# Patient Record
Sex: Female | Born: 1985 | Hispanic: Yes | Marital: Single
Health system: Southern US, Community
[De-identification: ages and names within clinical notes are randomized; demographics above are authoritative.]

## PROBLEM LIST (undated history)

## (undated) DIAGNOSIS — T8859XA Other complications of anesthesia, initial encounter: Secondary | ICD-10-CM

## (undated) DIAGNOSIS — R112 Nausea with vomiting, unspecified: Secondary | ICD-10-CM

## (undated) DIAGNOSIS — F419 Anxiety disorder, unspecified: Secondary | ICD-10-CM

## (undated) DIAGNOSIS — F32A Depression, unspecified: Secondary | ICD-10-CM

## (undated) DIAGNOSIS — Z87442 Personal history of urinary calculi: Secondary | ICD-10-CM

## (undated) DIAGNOSIS — I209 Angina pectoris, unspecified: Secondary | ICD-10-CM

## (undated) DIAGNOSIS — R000 Tachycardia, unspecified: Secondary | ICD-10-CM

## (undated) DIAGNOSIS — Z9889 Other specified postprocedural states: Secondary | ICD-10-CM

## (undated) HISTORY — PX: TUBAL LIGATION: SHX77

---

## 2020-10-01 ENCOUNTER — Encounter (HOSPITAL_COMMUNITY): Payer: Self-pay

## 2020-10-01 ENCOUNTER — Emergency Department (HOSPITAL_COMMUNITY)
Admission: EM | Admit: 2020-10-01 | Discharge: 2020-10-01 | Disposition: A | Discharge status: Home / Self Care | Payer: Self-pay | Attending: Emergency Medicine | Admitting: Emergency Medicine

## 2020-10-01 ENCOUNTER — Emergency Department (HOSPITAL_COMMUNITY): Payer: Self-pay

## 2020-10-01 DIAGNOSIS — G44329 Chronic post-traumatic headache, not intractable: Secondary | ICD-10-CM | POA: Insufficient documentation

## 2020-10-01 DIAGNOSIS — R000 Tachycardia, unspecified: Secondary | ICD-10-CM | POA: Insufficient documentation

## 2020-10-01 DIAGNOSIS — R42 Dizziness and giddiness: Secondary | ICD-10-CM | POA: Insufficient documentation

## 2020-10-01 LAB — COMPREHENSIVE METABOLIC PANEL
ALT: 31 U/L (ref 0–44)
AST: 27 U/L (ref 15–41)
Albumin: 4.2 g/dL (ref 3.5–5.0)
Alkaline Phosphatase: 74 U/L (ref 38–126)
Anion gap: 13 (ref 5–15)
BUN: 14 mg/dL (ref 6–20)
CO2: 25 mmol/L (ref 22–32)
Calcium: 9.5 mg/dL (ref 8.9–10.3)
Chloride: 104 mmol/L (ref 98–111)
Creatinine, Ser: 0.73 mg/dL (ref 0.44–1.00)
GFR calc non Af Amer: >60 mL/min (ref >60)
Glucose, Bld: 192 mg/dL — ABNORMAL HIGH (ref 70–99)
Potassium: 3.4 mmol/L — ABNORMAL LOW (ref 3.5–5.1)
Sodium: 142 mmol/L (ref 135–145)
Total Bilirubin: 0.6 mg/dL (ref 0.3–1.2)
Total Protein: 8.2 g/dL — ABNORMAL HIGH (ref 6.5–8.1)

## 2020-10-01 LAB — CBG MONITORING, ED: Glucose-Capillary: 152 mg/dL — ABNORMAL HIGH (ref 70–99)

## 2020-10-01 LAB — CBC
HCT: 42.1 % (ref 36.0–46.0)
Hemoglobin: 13.5 g/dL (ref 12.0–15.0)
MCH: 25 pg — ABNORMAL LOW (ref 26.0–34.0)
MCHC: 32.1 g/dL (ref 30.0–36.0)
MCV: 78.1 fL — ABNORMAL LOW (ref 80.0–100.0)
Platelets: 333 10*3/uL (ref 150–400)
RBC: 5.39 MIL/uL — ABNORMAL HIGH (ref 3.87–5.11)
RDW: 14.2 % (ref 11.5–15.5)
WBC: 9.9 10*3/uL (ref 4.0–10.5)
nRBC: 0 % (ref 0.0–0.2)

## 2020-10-01 LAB — ETHANOL: Alcohol, Ethyl (B): <10 mg/dL (ref <10)

## 2020-10-01 LAB — RAPID URINE DRUG SCREEN, HOSP PERFORMED
Amphetamines: NOT DETECTED
Barbiturates: NOT DETECTED
Benzodiazepines: NOT DETECTED
Cocaine: NOT DETECTED
Opiates: NOT DETECTED
Tetrahydrocannabinol: POSITIVE — AB

## 2020-10-01 LAB — I-STAT BETA HCG BLOOD, ED (MC, WL, AP ONLY): I-stat hCG, quantitative: <5 m[IU]/mL (ref <5)

## 2020-10-01 MED ORDER — SODIUM CHLORIDE 0.9 % IV BOLUS
1000.0000 mL | Freq: Once | INTRAVENOUS | Status: AC
  Administered 2020-10-01: 1000 mL via INTRAVENOUS

## 2020-10-01 MED ORDER — LORAZEPAM 2 MG/ML IJ SOLN
1.0000 mg | Freq: Once | INTRAMUSCULAR | Status: AC
  Administered 2020-10-01: 1 mg via INTRAVENOUS
  Filled 2020-10-01: qty 1
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
## 2020-10-01 NOTE — ED Triage Notes (Addendum)
Pt arrived via walk in with friend, pt unresponsive in wheelchair, able to follow some commands, but non verbal. Triage done using interpreter, pt unable to verbalize information, information gathered via phone from pt sister.   Per pt friend, pt called her saying she had just passed out and needed help, pt found in current state.

## 2020-10-01 NOTE — Discharge Instructions (Addendum)
You were seen in the ER for light headedness, palpitations and difficulty responding   CT head, labs all were normal. You had marijuana in your urine drug screen.   Call [facility] and schedule an appointment with a primary care doctor.  This clinic provides medical care and appointment for patients without medical insurance  I have given you contact information for neurology (brain doctors) that you can call or be referred to for more discussion of your headaches  the cause of your symptoms today is uncertain.  It may have been light headedness from being dehydrated or related to stress or anxiety  Vino a la sala de emergencias por [employer], palpitaciones y dificultad para responder  Tomografa computarizada de la cabeza, todos los laboratorios fueron normales. Tena marihuana en su anlisis de drogas de [place].  Llame a la clnica comunitaria de [facility] y programe una cita con un mdico de atencin primaria. Esta clnica brinda atencin mdica y citas para pacientes sin seguro mdico.  Conley Rolls he dado informacin de contacto de Software engineer (mdicos del cerebro) a la que Scientist, clinical (histocompatibility and immunogenetics) o ser referido para ms informacin sobre sus dolores de [place].  la causa de sus sntomas hoy es incierta. Puede haber sido un mareo por estar deshidratado o relacionado con el estrs o la ansiedad.
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
## 2020-12-10 ENCOUNTER — Ambulatory Visit: Payer: Self-pay | Admitting: Neurology

## 2020-12-10 ENCOUNTER — Encounter: Payer: Self-pay | Admitting: Neurology

## 2020-12-10 ENCOUNTER — Telehealth: Payer: Self-pay | Admitting: Neurology

## 2020-12-10 NOTE — Telephone Encounter (Signed)
No Show for neurology appointment. If patient calls back she can reschedule with any physician at [facility]. However, please explain to patient that she has no-showed once and if she no-shows again or cancels within 24 hours for another appointment, she may be dismissed from the practice.

## 2020-12-17 ENCOUNTER — Telehealth: Payer: Self-pay | Admitting: Neurology

## 2020-12-17 NOTE — Telephone Encounter (Signed)
Patient No Show letter was returned Address unknown - [facility] 12.15.2021

## 2021-03-11 ENCOUNTER — Other Ambulatory Visit: Payer: Self-pay

## 2021-03-11 ENCOUNTER — Ambulatory Visit (INDEPENDENT_AMBULATORY_CARE_PROVIDER_SITE_OTHER): Payer: Self-pay | Admitting: Primary Care

## 2021-03-11 ENCOUNTER — Encounter (INDEPENDENT_AMBULATORY_CARE_PROVIDER_SITE_OTHER): Payer: Self-pay | Admitting: Primary Care

## 2021-03-11 VITALS — BP 104/69 | HR 75 | Temp 97.3°F | Ht <= 58 in | Wt 138.6 lb

## 2021-03-11 DIAGNOSIS — Z7689 Persons encountering health services in other specified circumstances: Secondary | ICD-10-CM

## 2021-03-11 DIAGNOSIS — K219 Gastro-esophageal reflux disease without esophagitis: Secondary | ICD-10-CM

## 2021-03-11 DIAGNOSIS — G43909 Migraine, unspecified, not intractable, without status migrainosus: Secondary | ICD-10-CM | POA: Insufficient documentation

## 2021-03-11 DIAGNOSIS — G43C0 Periodic headache syndromes in child or adult, not intractable: Secondary | ICD-10-CM

## 2021-03-11 DIAGNOSIS — Z23 Encounter for immunization: Secondary | ICD-10-CM

## 2021-03-11 DIAGNOSIS — N926 Irregular menstruation, unspecified: Secondary | ICD-10-CM

## 2021-03-11 MED ORDER — OMEPRAZOLE 20 MG PO CPDR: 20.0000 mg | DELAYED_RELEASE_CAPSULE | Freq: Every day | ORAL | 3 refills | Status: DC

## 2021-03-11 NOTE — Progress Notes (Signed)
New Patient Office Visit  Subjective:  Patient ID: Lori Young, female    DOB: January 26, 1986  Age: 35 y.o. MRN: [id number]  CC:  Chief Complaint  Patient presents with  . New Patient (Initial Visit)    Stomach pain     HPI [name] is a 35 year old Hispanic female Lori Young [phone]) presents for establish care and she has complains of stomach pain. (upper gastric area) Pain for 6 months but recently has become unbearable and causing vomiting.  Aggravating factors foods that are spicy and fried foods. Burning sensation when it is empty . Pain is early in the morning denies coughing. Sleep with a pillow over her chest and sleep on her stomach.  No past medical history on file.  No past surgical history on file.  No family history on file.  Social History   Socioeconomic History  . Marital status: Single    Spouse name: Not on file  . Number of children: Not on file  . Years of education: Not on file  . Highest education level: Not on file  Occupational History  . Not on file  Tobacco Use  . Smoking status: Never Smoker  . Smokeless tobacco: Never Used  Substance and Sexual Activity  . Alcohol use: Not Currently  . Drug use: Never  . Sexual activity: Yes    Partners: Male  Other Topics Concern  . Not on file  Social History Narrative  . Not on file   Social Determinants of Health   Financial Resource Strain: Not on file  Food Insecurity: Not on file  Transportation Needs: Not on file  Physical Activity: Not on file  Stress: Not on file  Social Connections: Not on file  Intimate Partner Violence: Not on file    ROS Review of Systems  Gastrointestinal: Positive for abdominal distention, abdominal pain, nausea and vomiting.  Genitourinary:       Irregular cycles 3-4 days  Neurological: Positive for headaches.       Migraines   All other systems reviewed and are negative.   Objective:   Today's Vitals: BP 104/69 (BP Location: Right Arm, Patient  Position: Sitting, Cuff Size: Large)   Pulse 75   Temp (!) 97.3 F (36.3 C) (Temporal)   Ht 4\' 9"  (1.448 m)   Wt 138 lb 9.6 oz (62.9 kg)   SpO2 93%   BMI 29.99 kg/m   Physical Exam Vitals reviewed.  Constitutional:      Appearance: Normal appearance.  HENT:     Head: Normocephalic.     Right Ear: Tympanic membrane and external ear normal.     Left Ear: Tympanic membrane and external ear normal.     Nose: Nose normal.  Eyes:     Extraocular Movements: Extraocular movements intact.     Pupils: Pupils are equal, round, and reactive to light.  Cardiovascular:     Rate and Rhythm: Normal rate and regular rhythm.  Pulmonary:     Effort: Pulmonary effort is normal.     Breath sounds: Normal breath sounds.  Abdominal:     General: Bowel sounds are normal. There is distension.     Palpations: Abdomen is soft.  Musculoskeletal:        General: Normal range of motion.     Cervical back: Normal range of motion and neck supple.  Skin:    General: Skin is warm and dry.  Neurological:     Mental Status: She is alert and oriented  to person, place, and time.  Psychiatric:        Mood and Affect: Mood normal.        Behavior: Behavior normal.        Thought Content: Thought content normal.        Judgment: Judgment normal.      Assessment & Plan:  Lori Young was seen today for new patient (initial visit).  Diagnoses and all orders for this visit: Lori Young was seen today for new patient (initial visit).  Diagnoses and all orders for this visit:  Encounter to establish care Establish care  Need for Tdap vaccination -     Tdap vaccine greater than or equal to 7yo IM  Migraine with aura and without status migrainosus, not intractable  Drinks decaf coffee , no alcohol, smoking or increase caffeine use . She tx with drinking tea green tea  -     Comprehensive metabolic panel; Future  Gastroesophageal reflux disease without esophagitis Discussed eating small frequent meal,  reduction in acidic foods, fried foods ,spicy foods, alcohol caffeine and tobacco and certain medications. Avoid laying down after eating 66mins-1hour, elevated head of the bed. -     omeprazole (PRILOSEC) 20 MG capsule; Take 1 capsule (20 mg total) by mouth daily. -     Comprehensive metabolic panel; Future  Irregular menstrual cycle 3-4 day length first day painful than normal  -     CBC with Differential/Platelet; Future  Need for prophylactic vaccination and inoculation against influenza Completed    Outpatient Encounter Medications as of 33/16/2022  Medication Sig  . omeprazole (PRILOSEC) 20 MG capsule Take 1 capsule (20 mg total) by mouth daily.  Marland Kitchen acetaminophen (TYLENOL) 500 MG tablet Take 1,000 mg by mouth every 6 (six) hours as needed for headache.   No facility-administered encounter medications on file as of 03/11/2021.    Follow-up: Return if symptoms worsen or fail to improve.   Grayce Sessions, NP

## 2021-03-11 NOTE — Patient Instructions (Signed)
Gripe en los adultos Influenza, Adult A la gripe tambin se la conoce como "influenza". Es una [employer], la nariz y la garganta (vas respiratorias). Se transmite fcilmente de persona a persona (es contagiosa). La gripe causa sntomas que son [employer] de un resfro, junto con fiebre alta y dolores corporales. Cules son las causas? La causa de esta afeccin es el virus de la influenza. Puede contraer el virus de las siguientes maneras:  Al inhalar gotitas que quedan en el aire despus de que una persona infectada con gripe tosi o estornud.  Al tocar algo que est contaminado con el virus y [employer] mano a la boca, la nariz o los ojos. Qu incrementa el riesgo? Hay ciertas cosas que lo pueden hacer ms propenso a Nurse, adult. Estas incluyen lo siguiente:  No lavarse las manos con frecuencia.  Tener contacto cercano con [employer] durante la temporada de resfro y gripe.  Tocarse la boca, los ojos o la nariz sin antes lavarse las manos.  No recibir la SU[employer] INC. Puede correr un mayor riesgo de [place] gripe, y [place] graves, como una infeccin pulmonar (neumona), si usted:  Es mayor de 7 aos de edad.  Est embarazada.  Tiene debilitado el sistema que combate las defensas (sistema inmunitario) debido a una enfermedad o a que toma determinados medicamentos.  Tiene una afeccin a largo plazo (crnica), como las siguientes: ? Enfermedad cardaca, renal o pulmonar. ? Diabetes. ? Asma.  Tiene un trastorno heptico.  Tiene mucho sobrepeso (obesidad [place]).  Tiene anemia. Cules son los signos o sntomas? Los sntomas normalmente comienzan de repente y Sonda Primes [address] y [address] Pueden incluir los siguientes:  Cristy Hilts y [place].  Dolores de [place], dolores en el cuerpo o dolores musculares.  Dolor de Investment banker, operational.  Tos.  Secrecin o congestin nasal.  Molestias en el pecho.  No querer comer tanto como lo hace  normalmente.  Sensacin de debilidad o cansancio.  Mareos.  Malestar estomacal o vmitos. Cmo se trata? Si la gripe se detecta de forma temprana, puede recibir tratamiento con medicamentos antivirales. Esto puede ayudar a reducir la gravedad y la duracin de la enfermedad. Se los administrarn por boca o a travs de un tubo (catter) intravenoso. Cuidarse en su hogar puede ayudar a que mejoren los sntomas. El mdico puede recomendarle lo siguiente:  Tomar medicamentos de Radio broadcast assistant.  Beber abundante lquido. La gripe suele desaparecer sola. Si tiene sntomas muy graves u otros problemas, puede recibir tratamiento en un hospital. Siga estas instrucciones en su casa: Actividad  Descanse todo lo que sea necesario. Duerma lo suficiente.  Foy [place] en su casa y no concurra al [place] o a la escuela, como se lo haya indicado el mdico. ? No salga de su casa hasta que no haya tenido fiebre por 24horas sin tomar medicamentos. ? Salga de su casa solamente para ir al [employer]. Comida y bebida  Wyatt Haste SRO (solucin de rehidratacin oral). Es Ardelia Mems bebida que se vende en farmacias y tiendas.  Beba suficiente lquido como para Theatre manager la orina de color amarillo plido.  En la medida en que pueda, beba lquidos transparentes en pequeas cantidades. Los lquidos transparentes son, por ejemplo: ? Grayce Sessions. ? Trocitos de hielo. ? Jugo de frutas mezclado con agua. ? Bebidas deportivas de bajas caloras.  Coma alimentos suaves que sean fciles de digerir. En la medida que pueda, consuma pequeas cantidades. Estos alimentos incluyen: ? Bananas. ? Pur de [employer]. ?  Arroz. Driscilla Grammes. ? Tostadas. ? Galletas.  No coma ni beba lo siguiente: ? Lquidos con alto contenido de azcar o cafena. ? Alcohol. ? Alimentos condimentados o con alto contenido de [place] Indicaciones generales  Use los medicamentos de venta libre y los recetados solamente como se lo haya indicado el mdico.  Use un  humidificador de aire fro para que el aire de su casa est ms hmedo. Esto puede facilitar la respiracin. ? Cuando utilice un humidificador de vapor fro, lmpielo a diario. Vace el agua y [place] por agua limpia.  Al toser o estornudar, cbrase la boca y la [place].  Lvese las manos frecuentemente con agua y [place] y durante al menos 20 segundos. Esto tambin es importante despus de toser o Engineering geologist. Si no dispone de [place] y [place], use desinfectante para manos con alcohol.  Cumpla con todas las visitas de seguimiento.      Cmo se previene?  Colquese la vacuna antigripal todos los [place]. Puede colocarse la vacuna contra la gripe a fines de verano, en otoo o en invierno. Pregntele al mdico cundo debe aplicarse la vacuna contra la gripe.  Evite el contacto con personas que estn enfermas durante el otoo y el invierno. Es la temporada del resfro y Emergency planning/management officer.   Comunquese con un mdico si:  Tiene sntomas nuevos.  Tiene los siguientes sntomas: ? Dolor de Hotel manager. ? Materia fecal lquida (diarrea). ? Fiebre.  La tos empeora.  Empieza a tener ms mucosidad.  Tiene Programme researcher, broadcasting/film/video.  Vomita. Solicite ayuda de inmediato si:  Le falta el aire.  Tiene dificultad para respirar.  La piel o las uas se ponen de un color azulado.  Presenta dolor muy intenso o rigidez en el cuello.  Tiene dolor de cabeza repentino.  Le duele la cara o el odo de forma repentina.  No puede comer ni beber sin vomitar. Estos sntomas pueden representar un problema grave que constituye Radio broadcast assistant. Solicite atencin mdica de inmediato. Comunquese con el servicio de emergencias de su localidad (911 en los [place]).  No espere a ver si los sntomas desaparecen.  No conduzca por sus propios medios [employer]. Resumen  A la gripe tambin se la conoce como "influenza". Es una [employer] , la nariz y Administrator. Se transmite fcilmente de [place] persona a  otra.  Use los medicamentos de venta libre y los recetados solamente como se lo haya indicado el mdico.  Aplicarse la vacuna contra la gripe todos los aos es la mejor manera de no contagiarse la gripe. Esta informacin no tiene Theme park manager el consejo del mdico. Asegrese de hacerle al mdico cualquier pregunta que tenga. Document Revised: 10/09/2020 Document Reviewed: 10/09/2020 Elsevier Patient Education  2021 Elsevier Inc. [place] Tdap (ttanos, difteria y tos [place]): lo que debe saber Tdap (Tetanus, Diphtheria, Pertussis) Vaccine: What You Need to Know 1. Por qu vacunarse? La vacuna Tdap puede prevenir el ttanos, la difteria y la tos [place]. La difteria y la tos Benetta Spar se [place] de persona a Social worker. El ttanos ingresa al organismo a travs de cortes o heridas.  El [place] (T) provoca rigidez dolorosa en los msculos. El ttanos puede causar graves problemas de [place], como no poder abrir la boca, Warehouse manager dificultad para tragar y Industrial/product designer, o la muerte.  La DIFTERIA (D) puede causar dificultad para respirar, insuficiencia cardaca, parlisis o muerte.  La TOS FERINA (aP), tambin conocida como "tos convulsa", puede causar tos violenta e incontrolable lo que  hace difcil respirar, comer o beber. La tos ferina puede ser muy grave, especialmente en los bebs y en los nios pequeos, y causar neumona, convulsiones, dao cerebral o la [place]. En adolescentes y [address], puede causar prdida de [place], prdida del control de la vejiga, [place] y fracturas de Forensic psychologist al toser de Wellsite geologist intensa. 2. Madilyn Fireman Tdap La vacuna Tdap es solo para nios de 7 aos en adelante, adolescentes y [place].  Los adolescentes deben recibir una dosis nica de la vacuna Tdap, preferentemente a los 11 o 12 aos. Las personas embarazadas deben recibir una dosis de la vacuna Tdap durante cada embarazo, preferiblemente durante la primera parte del tercer trimestre, para ayudar a proteger al recin nacido de  la tos [place]. Los bebs tienen mayor riesgo de sufrir complicaciones graves y potencialmente mortales debido a la tos [place]. Los adultos que nunca recibieron la vacuna Tdap deben recibir una dosis. [place]ems, los adultos deben recibir una dosis de refuerzo de Tdap o Td (una vacuna diferente que protege contra el ttanos y la difteria, pero no contra la tos [place]) cada 10 aos, o despus de 5 aos en caso de herida o [place] grave o sucia. La vacuna Tdap puede ser administrada al mismo tiempo que otras vacunas. 3. Hable con el mdico Comunquese con la persona que le coloca las vacunas si la persona que la recibe:  Ha tenido una reaccin alrgica despus de Neomia Dear dosis anterior de cualquier vacuna contra el ttanos, la difteria o la tos [place], o cualquier Programmer, multimedia grave, potencialmente mortal  Ha tenido un coma, disminucin del nivel de la conciencia o convulsiones prolongadas dentro de los [address] posteriores a una dosis anterior de cualquier vacuna contra la tos Psychologist, educational (DTP, DTaP o Tdap)  Tiene convulsiones u otro problema del sistema nervioso  Alguna vez tuvo sndrome de Pension scheme manager (tambin llamado "SGB")  Ha tenido dolor intenso o hinchazn despus de una dosis anterior de cualquier vacuna contra el ttanos o la difteria En algunos casos, es posible que el mdico decida posponer la aplicacin de la vacuna Tdap para una visita en el futuro. Las personas que sufren trastornos menores, como un resfro, pueden vacunarse. Las personas que tienen enfermedades moderadas o graves generalmente deben esperar hasta recuperarse para poder recibir la vacuna Tdap.  Su mdico puede darle ms informacin. 4. Riesgos de [place] reaccin a la vacuna  Despus de recibir la vacuna Tdap a veces se puede Surveyor, mining, enrojecimiento o Paramedic donde se aplic la inyeccin, fiebre leve, dolor de cabeza, sensacin de cansancio y nuseas, vmitos, diarrea o dolor de [place]. Las personas a veces se  desmayan despus de procedimientos mdicos, incluida la vacunacin. Informe al mdico si se siente mareado, tiene cambios en la visin o zumbidos en los odos.  Al igual que con cualquier [employer] Data, existe una probabilidad muy remota de que una vacuna cause una reaccin alrgica grave, otra lesin grave o la muerte. 5. Qu pasa si se presenta un problema grave? Podra producirse una reaccin alrgica despus de que la persona vacunada abandone la clnica. Si observa signos de Runner, broadcasting/film/video grave (ronchas, hinchazn de la cara y la garganta, dificultad para respirar, latidos cardacos acelerados, mareos o debilidad), llame al 9-1-1 y lleve a la persona al hospital ms cercano. Si se presentan otros signos que le preocupan, comunquese con su mdico.  Las reacciones adversas deben informarse al Sistema de Informe de Eventos Adversos de Administrator, arts (Vaccine Adverse Event Reporting System, VAERS). Por lo general,  el mdico presenta este informe o puede hacerlo usted mismo. Visite el sitio web del VAERS en www.vaers.LAgents.no o llame al ([phone]. El VAERS es solo para Biomedical engineer, y los miembros de su personal no proporcionan asesoramiento mdico. 6. Programa Nacional de Compensacin de Daos por [employer] Electric [employer] El SunTrust de Compensacin de Daos por Administrator, arts (National Vaccine Injury Kohl's, Cabin crew) es un programa federal que fue creado para Patent examiner a las personas que puedan haber sufrido daos al recibir ciertas vacunas. Las Investment banker, operational a presuntas lesiones o muerte debidas a la vacunacin tienen un lmite de tiempo para su presentacin, que puede ser de tan solo [employer]. Visite el sitio web del VICP en SpiritualWord.at o llame al [phone] para obtener ms informacin acerca del programa y de cmo presentar un reclamo. 7. Cmo puedo obtener ms informacin?  Pregntele a su mdico.  Comunquese con el servicio de salud de su localidad o  su estado.  Visite el sitio web de Chief Technology Officer, Therapist, music) (Administracin de Alimentos y Media planner) para ver los prospectos de las vacunas e informacin adicional en FinderList.no.  Comunquese con Building control surveyor for [employer] Technology and Prevention, CDC (Centros para el Control y la Prevencin de Edison): ? Llame al [phone] (1-800-CDC-INFO) o ? Visite el sitio Environmental manager en PicCapture.uy. Declaracin de informacin de la vacuna Tdap (ttanos, difteria y [place]) (08/01/2020) Esta informacin no tiene Theme park manager el consejo del mdico. Asegrese de hacerle al mdico cualquier pregunta que tenga. Document Revised: 09/30/2020 Document Reviewed: 09/15/2020 Elsevier Patient Education  2021 [employer].

## 2021-06-18 ENCOUNTER — Encounter (HOSPITAL_COMMUNITY): Payer: Self-pay | Admitting: Pharmacy Technician

## 2021-06-18 ENCOUNTER — Emergency Department (HOSPITAL_COMMUNITY): Payer: Self-pay

## 2021-06-18 ENCOUNTER — Emergency Department (HOSPITAL_COMMUNITY)
Admission: EM | Admit: 2021-06-18 | Discharge: 2021-06-18 | Disposition: A | Discharge status: Home / Self Care | Payer: Self-pay | Attending: Emergency Medicine | Admitting: Emergency Medicine

## 2021-06-18 DIAGNOSIS — N200 Calculus of kidney: Secondary | ICD-10-CM

## 2021-06-18 DIAGNOSIS — K8020 Calculus of gallbladder without cholecystitis without obstruction: Secondary | ICD-10-CM

## 2021-06-18 LAB — COMPREHENSIVE METABOLIC PANEL
ALT: 29 U/L (ref 0–44)
AST: 40 U/L (ref 15–41)
Albumin: 3.7 g/dL (ref 3.5–5.0)
Alkaline Phosphatase: 57 U/L (ref 38–126)
Anion gap: 8 (ref 5–15)
BUN: 8 mg/dL (ref 6–20)
CO2: 25 mmol/L (ref 22–32)
Calcium: 9.1 mg/dL (ref 8.9–10.3)
Chloride: 105 mmol/L (ref 98–111)
Creatinine, Ser: 0.64 mg/dL (ref 0.44–1.00)
GFR, Estimated: >60 mL/min (ref >60)
Glucose, Bld: 110 mg/dL — ABNORMAL HIGH (ref 70–99)
Potassium: 4.3 mmol/L (ref 3.5–5.1)
Sodium: 138 mmol/L (ref 135–145)
Total Bilirubin: 1.2 mg/dL (ref 0.3–1.2)
Total Protein: 6.7 g/dL (ref 6.5–8.1)

## 2021-06-18 LAB — URINALYSIS, ROUTINE W REFLEX MICROSCOPIC
Bacteria, UA: NONE SEEN
Bilirubin Urine: NEGATIVE
Glucose, UA: NEGATIVE mg/dL
Ketones, ur: NEGATIVE mg/dL
Nitrite: NEGATIVE
Protein, ur: 30 mg/dL — AB
RBC / HPF: >50 RBC/hpf — ABNORMAL HIGH (ref 0–5)
Specific Gravity, Urine: 1.019 (ref 1.005–1.030)
WBC, UA: >50 WBC/hpf — ABNORMAL HIGH (ref 0–5)
pH: 8 (ref 5.0–8.0)

## 2021-06-18 LAB — I-STAT BETA HCG BLOOD, ED (MC, WL, AP ONLY): I-stat hCG, quantitative: <5 m[IU]/mL (ref <5)

## 2021-06-18 LAB — CBC
HCT: 40.9 % (ref 36.0–46.0)
Hemoglobin: 12.7 g/dL (ref 12.0–15.0)
MCH: 25 pg — ABNORMAL LOW (ref 26.0–34.0)
MCHC: 31.1 g/dL (ref 30.0–36.0)
MCV: 80.4 fL (ref 80.0–100.0)
Platelets: 341 10*3/uL (ref 150–400)
RBC: 5.09 MIL/uL (ref 3.87–5.11)
RDW: 14.1 % (ref 11.5–15.5)
WBC: 7.8 10*3/uL (ref 4.0–10.5)
nRBC: 0 % (ref 0.0–0.2)

## 2021-06-18 LAB — LIPASE, BLOOD: Lipase: 42 U/L (ref 11–51)

## 2021-06-18 MED ORDER — ONDANSETRON 4 MG PO TBDP: 4.0000 mg | ORAL_TABLET | Freq: Three times a day (TID) | ORAL | 0 refills | Status: DC | PRN

## 2021-06-18 MED ORDER — KETOROLAC TROMETHAMINE 15 MG/ML IJ SOLN
15.0000 mg | Freq: Once | INTRAMUSCULAR | Status: AC
  Administered 2021-06-18: 15 mg via INTRAVENOUS
  Filled 2021-06-18: qty 1

## 2021-06-18 MED ORDER — SODIUM CHLORIDE 0.9 % IV SOLN
1.0000 g | Freq: Once | INTRAVENOUS | Status: AC
  Administered 2021-06-18: 1 g via INTRAVENOUS
  Filled 2021-06-18: qty 10

## 2021-06-18 MED ORDER — CEPHALEXIN 500 MG PO CAPS: 500.0000 mg | ORAL_CAPSULE | Freq: Three times a day (TID) | ORAL | 0 refills | Status: AC

## 2021-06-18 MED ORDER — FENTANYL CITRATE (PF) 100 MCG/2ML IJ SOLN
50.0000 ug | Freq: Once | INTRAMUSCULAR | Status: AC
  Administered 2021-06-18: 50 ug via INTRAVENOUS
  Filled 2021-06-18: qty 2

## 2021-06-18 MED ORDER — TAMSULOSIN HCL 0.4 MG PO CAPS: 0.4000 mg | ORAL_CAPSULE | Freq: Every day | ORAL | 0 refills | Status: AC

## 2021-06-18 MED ORDER — IOHEXOL 300 MG/ML  SOLN
100.0000 mL | Freq: Once | INTRAMUSCULAR | Status: AC | PRN
  Administered 2021-06-18: 100 mL via INTRAVENOUS

## 2021-06-18 MED ORDER — SODIUM CHLORIDE 0.9 % IV BOLUS
1000.0000 mL | Freq: Once | INTRAVENOUS | Status: AC
  Administered 2021-06-18: 1000 mL via INTRAVENOUS

## 2021-06-18 NOTE — Discharge Instructions (Addendum)
At this time there does not appear to be the presence of an emergent medical condition, however there is always the potential for conditions to change. Please read and follow the below instructions.  Please return to the Emergency Department immediately for any new or worsening symptoms. Please be sure to follow up with your Primary Care Provider within one week regarding your visit today; please call their office to schedule an appointment even if you are feeling better for a follow-up visit. Please call the urologist Dr. Mena Goes today to schedule follow-up appointment for further evaluation treatment of your kidney stone.  Please take the antibiotic Keflex as prescribed for treatment of bacteria in your urine.  Please use the medication Flomax as prescribed to help facilitate stone passage.  You may use the medication Zofran as prescribed to help with nausea.   Your CT scan also showed evidence of stones within your gallbladder.  Please call your primary care provider to schedule a follow-up ultrasound for further evaluation of the stones.  If you do not have a primary care provider please call [facility] under discharge paperwork to obtain one.  Go to the nearest Emergency Department immediately if: You have fever or chills You develop severe pain. You develop new abdominal pain. You faint. You are unable to urinate.  Please read the additional information packets attached to your discharge summary.  Do not take your medicine if  develop an itchy rash, swelling in your mouth or lips, or difficulty breathing; call 911 and seek immediate emergency medical attention if this occurs.  You may review your lab tests and imaging results in their entirety on your MyChart account.  Please discuss all results of fully with your primary care provider and other specialist at your follow-up visit.  Note: Portions of this text may have been transcribed using voice recognition  software. Every effort was made to ensure accuracy; however, inadvertent computerized transcription errors may still be present. ============= Below has been translated using Google translate.  Errors may be present.  Interpret with caution.  A continuacin se ha traducido con Soil scientist. Puede haber errores. Interprete con precaucin. ============= En este momento no parece haber la presencia de una condicin mdica emergente, sin embargo, siempre existe la posibilidad de que las condiciones [place]. Lea y siga las instrucciones a continuacin.  1. Regrese al Departamento de Emergencias de inmediato si presenta sntomas nuevos o que empeoran. 2. Asegrese de hacer un seguimiento con su proveedor de atencin primaria dentro de una semana con respecto a su visita de hoy; llame a su oficina para programar una cita, incluso si se siente mejor para una visita de seguimiento. 3. Llame hoy al urlogo Dr. Mena Goes para programar Neomia Dear cita de seguimiento para una evaluacin adicional del tratamiento de su clculo renal. Tome el antibitico Keflex segn lo prescrito para el tratamiento de bacterias en la orina. Utilice el medicamento Flomax segn lo prescrito para ayudar a Customer service manager de los clculos. Puede usar Research scientist (medical) Zofran segn lo prescrito para ayudar con las nuseas. 4. Su tomografa computarizada tambin mostr evidencia de clculos dentro de su vescula biliar. Llame a su proveedor de atencin primaria para programar una ecografa de seguimiento para una evaluacin adicional de los clculos. Si no tiene un proveedor de Marine scientist, llame a [employer] and Wellness bajo la documentacin de alta para obtener uno.  Vaya al Departamento de Emergencias ms cercano de inmediato si: ? Tienes  fiebre o escalofros  Best boy.  Desarrolla un nuevo dolor abdominal.  Te desmayas.  No puede orinar.  Lea los paquetes de informacin adicional adjuntos a su  resumen de alta.  No tome su medicamento si presenta sarpullido con picazn, hinchazn en la boca o los labios, o dificultad para respirar; llame al 911 y busque atencin mdica de emergencia inmediata si esto ocurre.  Puede revisar sus pruebas de laboratorio y [place] de imgenes en su totalidad en su cuenta MyChart. Discuta todos los resultados de completamente con su proveedor de atencin primaria y Dietitian en su visita de seguimiento.  Nota: Es posible que partes de este texto se hayan transcrito con un software de Network engineer de voz. Se hizo todo lo posible para garantizar la precisin; sin embargo, an pueden estar presentes errores de transcripcin computarizados involuntarios.

## 2021-06-18 NOTE — ED Notes (Signed)
ED Provider at bedside. 

## 2021-06-18 NOTE — ED Triage Notes (Signed)
Pt reports lower back pain yesterday and this morning pt states pain moved to the R side of her back and into her R lower abdomen. Denies NVD. Denies fevers or pain with urination.

## 2021-06-18 NOTE — ED Provider Notes (Signed)
Emergency Medicine Provider Triage Evaluation Note  Lori Young , a 35 y.o. female  was evaluated in triage.  Pt complains of right lower back pain x yesterday radiating to the front of the abdomen. Also had nausea, has not taken any meds for relieve. No fever, vomiting, no urinary symptoms. LMP 6/18. Prior surgical hx of Csection.   Review of Systems  Positive: ]abdominal pain, nausea Negative: Fever, urinary symptoms  Physical Exam  BP 111/79 (BP Location: Left Arm)   Pulse 79   Temp 98.7 F (37.1 C) (Oral)   Resp 16   SpO2 100%  Gen:   Awake, no distress  \ Resp:  Normal effort  MSK:   Moves extremities without difficulty  Other:    Medical Decision Making  Medically screening exam initiated at 7:26 AM.  Appropriate orders placed.  Desta Langley Flatley was informed that the remainder of the evaluation will be completed by another provider, this initial triage assessment does not replace that evaluation, and the importance of remaining in the ED until their evaluation is complete.    Claude Manges, PA-C 06/18/21 [id number]    Alvira Monday, MD 06/19/21 [phone]

## 2021-06-18 NOTE — ED Provider Notes (Signed)
[facility] EMERGENCY DEPARTMENT Provider Note   CSN: [id number] Arrival date & time: 06/18/21  8527     History Chief Complaint  Patient presents with   Abdominal Pain   Back Pain   Spanish translator used during this visit. Lori Young is a 35 y.o. female otherwise healthy no daily medication use presents for right flank pain onset yesterday, rating down to her right lower quadrant, constant, aching, nonradiating, no clear aggravating or alleviating factors.  She reports is associated with some nausea without vomiting, occasional burning when she urinates.  Denies fever/chills, vomiting, diarrhea, concern for STI, abnormal vaginal bleeding or discharge, fall/injury, numbness/tingling, weakness or any additional concerns.  HPI     History reviewed. No pertinent past medical history.  There are no problems to display for this patient.   History reviewed. No pertinent surgical history.   OB History   No obstetric history on file.     No family history on file.     Home Medications Prior to Admission medications   Medication Sig Start Date End Date Taking? Authorizing Provider  aspirin-acetaminophen-caffeine (EXCEDRIN MIGRAINE) [id number] MG tablet Take 2 tablets by mouth daily as needed for headache or migraine.   Yes [provider]  omeprazole (PRILOSEC) 20 MG capsule Take 20 mg by mouth daily.   Yes [provider]    Allergies    Patient has no known allergies.  Review of Systems   Review of Systems Ten systems are reviewed and are negative for acute change except as noted in the HPI  Physical Exam Updated Vital Signs BP (!) 109/57   Pulse 97   Temp 98.7 F (37.1 C) (Oral)   Resp 20   Ht 5' (1.524 m)   Wt 59 kg   SpO2 100%   BMI 25.39 kg/m   Physical Exam Constitutional:      General: She is not in acute distress.    Appearance: Normal appearance. She is well-developed. She is not ill-appearing or  diaphoretic.  HENT:     Head: Normocephalic and atraumatic.  Eyes:     General: Vision grossly intact. Gaze aligned appropriately.     Pupils: Pupils are equal, round, and reactive to light.  Neck:     Trachea: Trachea and phonation normal.  Pulmonary:     Effort: Pulmonary effort is normal. No respiratory distress.  Abdominal:     General: There is no distension.     Palpations: Abdomen is soft.     Tenderness: There is abdominal tenderness. There is right CVA tenderness. There is no left CVA tenderness, guarding or rebound. Positive signs include McBurney's sign.  Musculoskeletal:        General: Normal range of motion.     Cervical back: Normal range of motion.  Skin:    General: Skin is warm and dry.  Neurological:     Mental Status: She is alert.     GCS: GCS eye subscore is 4. GCS verbal subscore is 5. GCS motor subscore is 6.     Comments: Speech is clear and goal oriented, follows commands Major Cranial nerves without deficit, no facial droop Moves extremities without ataxia, coordination intact  Psychiatric:        Behavior: Behavior normal.    ED Results / Procedures / Treatments   Labs (all labs ordered are listed, but only abnormal results are displayed) Labs Reviewed  COMPREHENSIVE METABOLIC PANEL - Abnormal; Notable for the following components:  Result Value   Glucose, Bld 110 (*)    All other components within normal limits  CBC - Abnormal; Notable for the following components:   MCH 25.0 (*)    All other components within normal limits  URINALYSIS, ROUTINE W REFLEX MICROSCOPIC - Abnormal; Notable for the following components:   APPearance HAZY (*)    Hgb urine dipstick LARGE (*)    Protein, ur 30 (*)    Leukocytes,Ua SMALL (*)    RBC / HPF >50 (*)    WBC, UA >50 (*)    All other components within normal limits  URINE CULTURE  LIPASE, BLOOD  I-STAT BETA HCG BLOOD, ED ([facility], [facility], [facility] ONLY)    EKG None  Radiology CT ABDOMEN PELVIS W  CONTRAST  Result Date: 06/18/2021 CLINICAL DATA:  Abdominal abscess/infection suspected RLQ abdominal pain, appendicitis suspected (Age >= 14y) RLQ abd pain EXAM: CT ABDOMEN AND PELVIS WITH CONTRAST TECHNIQUE: Multidetector CT imaging of the abdomen and pelvis was performed using the standard protocol following bolus administration of intravenous contrast. CONTRAST:  OMNIPAQUE IOHEXOL 300 MG/ML  SOLN COMPARISON:  None. FINDINGS: Lower chest: Bibasilar hypoventilatory changes. Anterior right middle lobe atelectasis. Hepatobiliary: No focal liver abnormality is seen. There are probable radiolucent stones within the gallbladder. No evidence of cholecystitis. Pancreas: Unremarkable. No pancreatic ductal dilatation or surrounding inflammatory changes. Spleen: Normal in size without focal abnormality. Adrenals/Urinary Tract: Adrenal glands are unremarkable. There is right-sided hydroureteronephrosis with obstructing 6 mm stone in the mid ureter (coronal image 66). There is mild ureteral enhancement and periureteral stranding proximally. There is a tiny right lower pole renal cyst. The bladder is unremarkable. There is a small left renal cyst, otherwise left kidney and ureter are unremarkable. Stomach/Bowel: No evidence of bowel obstruction. Normal appendix. Scattered colonic diverticuli. Vascular/Lymphatic: No significant vascular findings are present. No enlarged abdominal or pelvic lymph nodes. Reproductive: Unremarkable. Other: No abdominal wall hernia or abnormality. No abdominopelvic ascites. Musculoskeletal: No acute or significant osseous findings. IMPRESSION: Obstructing 6 mm stone in the mid right ureter with upstream hydroureteronephrosis, mild ureteral enhancement and periureteral stranding proximally. Normal appendix. Probable radiolucent stones within the gallbladder, consider nonemergent right upper quadrant ultrasound to confirm. No evidence of cholecystitis. Electronically Signed   By: Caprice Renshaw    On: 06/18/2021 13:33    Procedures Procedures   Medications Ordered in ED Medications  ketorolac (TORADOL) 15 MG/ML injection 15 mg (has no administration in time range)  cefTRIAXone (ROCEPHIN) 1 g in sodium chloride 0.9 % 100 mL IVPB (has no administration in time range)  sodium chloride 0.9 % bolus 1,000 mL (0 mLs Intravenous Stopped 06/18/21 1413)  fentaNYL (SUBLIMAZE) injection 50 mcg (50 mcg Intravenous Given 06/18/21 1126)  iohexol (OMNIPAQUE) 300 MG/ML solution 100 mL (100 mLs Intravenous Contrast Given 06/18/21 1251)    ED Course  I have reviewed the triage vital signs and the nursing notes.  Pertinent labs & imaging results that were available during my care of the patient were reviewed by me and considered in my medical decision making (see chart for details).  Clinical Course as of 06/18/21 1505  Thu Jun 18, 2021  1355 Toradol, Rocephin; d/c on flomax and keflex. Follow-up in office - Dr. Mena Goes [BM]    Clinical Course User Index [BM] Elizabeth Palau   MDM Rules/Calculators/A&P                         Additional history obtained  from: Nursing notes from this visit. Review of electronic medical records, no prior ER visits. ------------------------- 35 year old otherwise healthy female presented for right flank and right lower quadrant abdominal pain onset yesterday some nausea and dysuria associated.  She is tender in the right lower quadrant.  Risk versus benefits of CT imaging were discussed with this patient, she agrees for CT imaging for further evaluation of her right side abdominal pain.  Will give pain medication and IV fluids and monitor. ------------------ I ordered, reviewed and interpreted labs which include: Pregnancy test negative. Urinalysis shows 50 WBCs, 50 RBCs, small leukocytes.  No nitrites. Urine culture pending. CMP shows no emergent electrolyte derangement, AKI, LFT elevations or gap. Lipase normal limits. CBC shows no leukocytosis,  anemia or thrombocytopenia.  CTAP: IMPRESSION:  Obstructing 6 mm stone in the mid right ureter with upstream  hydroureteronephrosis, mild ureteral enhancement and periureteral  stranding proximally.     Normal appendix.     Probable radiolucent stones within the gallbladder, consider  nonemergent right upper quadrant ultrasound to confirm. No evidence  of cholecystitis.   1:55 PM: Consult with urologist Dr. Mena Goes.  Dr. Mena Goes advises given patient 1 g IV Rocephin here in the ER along with Toradol for pain control.  Dr. Mena Goes also recommends discharging this patient on Flomax and Keflex and to follow-up in his office for further evaluation and treatment.  No indication for admission, low suspicion for UTI. ------ Patient updated on findings above and of urology recommendations and states understanding.  I encourage patient to follow-up closely with urology for treatment of her kidney stone.  She was also given PCP referral for follow-up of her likely gallstone as well and encouraged to call this week to schedule an appointment..   At this time there does not appear to be any evidence of an acute emergency medical condition and the patient appears stable for discharge with appropriate outpatient follow up. Diagnosis was discussed with patient who verbalizes understanding of care plan and is agreeable to discharge. I have discussed return precautions with patient who verbalizes understanding. Patient encouraged to follow-up with their PCP and urology. All questions answered.  Patient's case discussed with Dr. Lynelle Doctor who agrees with plan to discharge with follow-up.   Spanish video interpreter used throughout this visit.  Note: Portions of this report may have been transcribed using voice recognition software. Every effort was made to ensure accuracy; however, inadvertent computerized transcription errors may still be present.  Final Clinical Impression(s) / ED Diagnoses Final  diagnoses:  Kidney stone on right side  Calculus of gallbladder without cholecystitis without obstruction    Rx / DC Orders ED Discharge Orders          Ordered    cephALEXin (KEFLEX) 500 MG capsule  3 times daily        06/18/21 1521    tamsulosin (FLOMAX) 0.4 MG CAPS capsule  Daily after breakfast        06/18/21 1521    ondansetron (ZOFRAN ODT) 4 MG disintegrating tablet  Every 8 hours PRN        06/18/21 1521             Elizabeth Palau 06/18/21 1525    Linwood Dibbles, MD 06/22/21 1353

## 2021-06-21 LAB — URINE CULTURE: Culture: 50000 — AB

## 2021-06-22 ENCOUNTER — Telehealth: Payer: Self-pay

## 2021-06-22 NOTE — Telephone Encounter (Signed)
Post ED Visit - Positive Culture Follow-up  Culture report reviewed by antimicrobial stewardship pharmacist: [facility] []  , Pharm.D. []  Enzo Bi, Pharm.D., BCPS AQ-ID []  , Pharm.D., BCPS []  Celedonio Miyamoto, .D., BCPS []  [place], .D., BCPS, AAHIVP []  Georgina Pillion, Pharm.D., BCPS, AAHIVP []  [address], PharmD, BCPS []  , PharmD, BCPS []  [place], PharmD, BCPS []  [address], PharmD []  , PharmD, BCPS [x]  Estella Husk, PharmD  Pharmacy Team []  Lysle Pearl, PharmD []  , PharmD []  Phillips Climes, PharmD []  , Rph []  Agapito Games) , PharmD []  Verlan Friends, PharmD []  , PharmD []  Mervyn Gay, PharmD []  , PharmD []  Loleta Dicker, PharmD []  [facility], PharmD []  , PharmD []  Len Childs, PharmD   Positive urine culture Treated with Cephalexin, organism sensitive to the same and no further patient follow-up is required at this time.  06/22/2021, 9:27 AM

## 2021-07-06 ENCOUNTER — Encounter (HOSPITAL_COMMUNITY): Payer: Self-pay

## 2021-07-06 ENCOUNTER — Emergency Department (HOSPITAL_COMMUNITY): Payer: Self-pay

## 2021-07-06 ENCOUNTER — Other Ambulatory Visit: Payer: Self-pay

## 2021-07-06 ENCOUNTER — Inpatient Hospital Stay (HOSPITAL_COMMUNITY)
Admission: EM | Admit: 2021-07-06 | Discharge: 2021-07-08 | DRG: 661 | Disposition: A | Discharge status: Home / Self Care | Payer: Self-pay | Attending: Internal Medicine | Admitting: Internal Medicine

## 2021-07-06 DIAGNOSIS — N136 Pyonephrosis: Principal | ICD-10-CM | POA: Diagnosis present

## 2021-07-06 DIAGNOSIS — K219 Gastro-esophageal reflux disease without esophagitis: Secondary | ICD-10-CM | POA: Diagnosis present

## 2021-07-06 DIAGNOSIS — E739 Lactose intolerance, unspecified: Secondary | ICD-10-CM | POA: Diagnosis present

## 2021-07-06 DIAGNOSIS — N200 Calculus of kidney: Secondary | ICD-10-CM

## 2021-07-06 DIAGNOSIS — Z79899 Other long term (current) drug therapy: Secondary | ICD-10-CM

## 2021-07-06 DIAGNOSIS — Z20822 Contact with and (suspected) exposure to covid-19: Secondary | ICD-10-CM | POA: Diagnosis present

## 2021-07-06 DIAGNOSIS — R739 Hyperglycemia, unspecified: Secondary | ICD-10-CM | POA: Diagnosis present

## 2021-07-06 DIAGNOSIS — Z6829 Body mass index (BMI) 29.0-29.9, adult: Secondary | ICD-10-CM

## 2021-07-06 DIAGNOSIS — E669 Obesity, unspecified: Secondary | ICD-10-CM | POA: Diagnosis present

## 2021-07-06 LAB — BASIC METABOLIC PANEL
Anion gap: 14 (ref 5–15)
BUN: 15 mg/dL (ref 6–20)
CO2: 21 mmol/L — ABNORMAL LOW (ref 22–32)
Calcium: 9.5 mg/dL (ref 8.9–10.3)
Chloride: 105 mmol/L (ref 98–111)
Creatinine, Ser: 0.72 mg/dL (ref 0.44–1.00)
GFR, Estimated: >60 mL/min (ref >60)
Glucose, Bld: 135 mg/dL — ABNORMAL HIGH (ref 70–99)
Potassium: 3.6 mmol/L (ref 3.5–5.1)
Sodium: 140 mmol/L (ref 135–145)

## 2021-07-06 LAB — I-STAT BETA HCG BLOOD, ED (MC, WL, AP ONLY): I-stat hCG, quantitative: <5 m[IU]/mL (ref <5)

## 2021-07-06 MED ORDER — KETOROLAC TROMETHAMINE 30 MG/ML IJ SOLN
30.0000 mg | Freq: Once | INTRAMUSCULAR | Status: AC
  Administered 2021-07-06: 30 mg via INTRAVENOUS
  Filled 2021-07-06: qty 1

## 2021-07-06 MED ORDER — HYDROMORPHONE HCL 1 MG/ML IJ SOLN
1.0000 mg | Freq: Once | INTRAMUSCULAR | Status: AC
  Administered 2021-07-06: 1 mg via INTRAVENOUS
  Filled 2021-07-06: qty 1

## 2021-07-06 MED ORDER — ONDANSETRON HCL 4 MG/2ML IJ SOLN
4.0000 mg | Freq: Once | INTRAMUSCULAR | Status: AC
  Administered 2021-07-06: 4 mg via INTRAVENOUS
  Filled 2021-07-06: qty 2

## 2021-07-06 NOTE — ED Triage Notes (Signed)
Diagnosed with kidney stone and having lower back pain with shob. Ems reports hyperventilation on arrival. Hx of anxiety. Ems admin of fentanyl

## 2021-07-06 NOTE — ED Notes (Signed)
Lori Young [phone] patients niece would like a call back for information on this patient

## 2021-07-06 NOTE — ED Notes (Signed)
Pt requested to lay on the floor in triage area due to her pain, pt refused to get back in the wheelchair, reports the floor is more comfortable.  Pt did not fall.

## 2021-07-06 NOTE — ED Provider Notes (Addendum)
[facility]-EMERGENCY DEPT Provider Note   CSN: [id number] Arrival date & time: 07/06/21  5053     History Chief Complaint  Patient presents with   Back Pain    Lori Young is a 35 y.o. female with a history of kidney stones present emergency department left-sided flank pain.  She reports abrupt onset of pain in her left lower back this afternoon.  She says the pain is 10 out of 10 in severity.  She has vomiting.  She says it feels like kidney stones.  She reports dysuria.  She arrives by EMS who gave her 50 mcg of fentanyl.  She denies any history of abdominal surgery.  She denies other medical problems or any medication allergies.  She describes a pressure sensation when she is trying to urinate. She is primarily Spanish-speaking and Engineer, structural was used.    The patient reports that she was taken o another hospital ED approximately 2 weeks ago, though she cannot recall which hospital this was, she was told that she had a kidney stone at that time.  She was discharged on medications and states that she felt so much better and was not having pain until abruptly returned today.   She denies ever noting that she passed a stone in the interim two weeks.  HPI     History reviewed. No pertinent past medical history.  Patient Active Problem List   Diagnosis Date Noted   GERD (gastroesophageal reflux disease) 03/11/2021   Migraines 03/11/2021    History reviewed. No pertinent surgical history.   OB History   No obstetric history on file.     History reviewed. No pertinent family history.  Social History   Tobacco Use   Smoking status: Never   Smokeless tobacco: Never  Substance Use Topics   Alcohol use: Not Currently   Drug use: Never    Home Medications Prior to Admission medications   Medication Sig Start Date End Date Taking? Authorizing Provider  ibuprofen (ADVIL) 600 MG tablet Take 1 tablet (600 mg total) by mouth every 6 (six) hours as  needed for mild pain or moderate pain. 07/07/21 08/06/21 Yes Maame Dack, Kermit Balo, MD  oxyCODONE-acetaminophen (PERCOCET/ROXICET) 5-325 MG tablet Take 1 tablet by mouth every 6 (six) hours as needed for up to 15 doses for severe pain. 07/07/21  Yes Anias Bartol, Kermit Balo, MD  tamsulosin (FLOMAX) 0.4 MG CAPS capsule Take 1 capsule (0.4 mg total) by mouth daily after breakfast for 15 days. 07/07/21 07/22/21 Yes Mykaela Arena, Kermit Balo, MD  acetaminophen (TYLENOL) 500 MG tablet Take 1,000 mg by mouth every 6 (six) hours as needed for headache.    [provider]  omeprazole (PRILOSEC) 20 MG capsule Take 1 capsule (20 mg total) by mouth daily. 03/11/21   Grayce Sessions, NP    Allergies    Lactose intolerance (gi)  Review of Systems   Review of Systems  Constitutional:  Negative for chills and fever.  Eyes:  Negative for pain and visual disturbance.  Respiratory:  Negative for cough and shortness of breath.   Cardiovascular:  Negative for chest pain and palpitations.  Gastrointestinal:  Positive for nausea and vomiting. Negative for abdominal pain.  Genitourinary:  Positive for flank pain. Negative for dysuria.  Musculoskeletal:  Positive for arthralgias and myalgias.  Skin:  Negative for color change and rash.  Neurological:  Negative for syncope and light-headedness.  All other systems reviewed and are negative.  Physical Exam Updated Vital Signs BP Marland Kitchen)  103/50   Pulse (!) 57   Resp 18   Ht 4\' 9"  (1.448 m)   Wt 62 kg   LMP  (Approximate)   SpO2 100%   BMI 29.58 kg/m   Physical Exam Constitutional:      General: She is not in acute distress. HENT:     Head: Normocephalic and atraumatic.  Eyes:     Conjunctiva/sclera: Conjunctivae normal.     Pupils: Pupils are equal, round, and reactive to light.  Cardiovascular:     Rate and Rhythm: Normal rate and regular rhythm.  Pulmonary:     Effort: Pulmonary effort is normal. No respiratory distress.  Abdominal:     General: There is no  distension.     Tenderness: There is no abdominal tenderness. There is left CVA tenderness. There is no right CVA tenderness, guarding or rebound.  Skin:    General: Skin is warm and dry.  Neurological:     General: No focal deficit present.     Mental Status: She is alert. Mental status is at baseline.  Psychiatric:        Mood and Affect: Mood normal.        Behavior: Behavior normal.    ED Results / Procedures / Treatments   Labs (all labs ordered are listed, but only abnormal results are displayed) Labs Reviewed  BASIC METABOLIC PANEL - Abnormal; Notable for the following components:      Result Value   CO2 21 (*)    Glucose, Bld 135 (*)    All other components within normal limits  CBC WITH DIFFERENTIAL/PLATELET  URINALYSIS, ROUTINE W REFLEX MICROSCOPIC  CBC WITH DIFFERENTIAL/PLATELET  I-STAT BETA HCG BLOOD, ED ([facility], [facility], [facility] ONLY)    EKG None  Radiology CT Renal Stone Study  Result Date: 07/06/2021 CLINICAL DATA:  35 year old female with flank pain. Concern for kidney stone. EXAM: CT ABDOMEN AND PELVIS WITHOUT CONTRAST TECHNIQUE: Multidetector CT imaging of the abdomen and pelvis was performed following the standard protocol without IV contrast. COMPARISON:  None. FINDINGS: Evaluation of this exam is limited in the absence of intravenous contrast. Lower chest: Right lung base atelectasis. The visualized lung bases are otherwise clear. No intra-abdominal free air or free fluid. Hepatobiliary: The liver is unremarkable. No intrahepatic biliary ductal dilatation. Several noncalcified stones noted within the gallbladder. No pericholecystic fluid or evidence of acute cholecystitis by CT. Ultrasound may provide better evaluation of the gallbladder if clinically indicated. Pancreas: Unremarkable. No pancreatic ductal dilatation or surrounding inflammatory changes. Spleen: Normal in size without focal abnormality. Adrenals/Urinary Tract: The adrenal glands are unremarkable. There is a 9  mm stone in the distal right ureter with mild right hydronephroureter. The left kidney, left ureter, and urinary bladder appear unremarkable. Stomach/Bowel: There is no bowel obstruction or active inflammation. The appendix is normal. Vascular/Lymphatic: The abdominal aorta and IVC are grossly unremarkable on this noncontrast CT. No portal venous gas. There is no adenopathy. Reproductive: The uterus is anteverted. No adnexal masses. Other: None Musculoskeletal: No acute or significant osseous findings. IMPRESSION: 1. A 9 mm distal right ureteral stone with mild right hydronephroureter. 2. Cholelithiasis. Electronically Signed   By: 34 M.D.   On: 07/06/2021 23:41    Procedures Procedures   Medications Ordered in ED Medications  oxyCODONE-acetaminophen (PERCOCET/ROXICET) 5-325 MG per tablet 1 tablet (has no administration in time range)  ibuprofen (ADVIL) tablet 600 mg (has no administration in time range)  ketorolac (TORADOL) 30 MG/ML injection 30 mg (30  mg Intravenous Given 07/06/21 2246)  ondansetron (ZOFRAN) injection 4 mg (4 mg Intravenous Given 07/06/21 2245)  HYDROmorphone (DILAUDID) injection 1 mg (1 mg Intravenous Given 07/06/21 2245)    ED Course  I have reviewed the triage vital signs and the nursing notes.  Pertinent labs & imaging results that were available during my care of the patient were reviewed by me and considered in my medical decision making (see chart for details).  Ddx includes ureteral colic vs kidney stone vs UTI vs back spasms vs other  IV pain and nausea medications ordered. CT renal study ordered.  UA ordered.  * Patient has a 9 mm distal right ureteral stone with mild hydronephrosis.  Her kidney function and creatinine are normal.  We are still awaiting a UA and CBC, but she does not appear to be septic per her vitals.  *  I cannot find records from another hospital on [facility] to confirm whether she had the same kidney stone two weeks ago  during her last ED visit.  It's unclear if this is a new stone or not.   *  We were able to significantly improve her pain and nausea with these medications.  However given the severity of her symptoms, I do think it be reasonable to observe her obstipation overnight and switch her to her p.o. pain medications, which I already prescribed to her pharmacy.  If her pain is better controlled tomorrow, and her CBC & UA are not suggestive of a significant infection, and she is tolerating fluids, I think would still be reasonable to continue with a trial of passage at home.    Unfortunately she does not have insurance and does not have any reasonable way to follow-up rapidly with urology in the office.  It is not clear to me whether 9 mm stone will pass on its own, but I am optimistic that it has already reached the distal left ureter.  Plan to reassess the patient's pain and p.o. challenge here in the morning.  If she is better she can be discharged.  If she has intractable pain or vomiting, she may need to be admitted at that time for urology evaluation.   Clinical Course as of 07/07/21 0914  Mon Jul 06, 2021  2344 IMPRESSION: 1. A 9 mm distal right ureteral stone with mild right hydronephroureter. 2. Cholelithiasis. [MT]    Clinical Course User Index [MT] Gordan Grell, Kermit Balo, MD    Final Clinical Impression(s) / ED Diagnoses Final diagnoses:  Kidney stone    Rx / DC Orders ED Discharge Orders          Ordered    tamsulosin (FLOMAX) 0.4 MG CAPS capsule  Daily after breakfast        07/07/21 0039    ibuprofen (ADVIL) 600 MG tablet  Every 6 hours PRN        07/07/21 0039    oxyCODONE-acetaminophen (PERCOCET/ROXICET) 5-325 MG tablet  Every 6 hours PRN        07/07/21 0057             Terald Sleeper, MD 07/07/21 8891    Terald Sleeper, MD 07/07/21 [phone]

## 2021-07-07 ENCOUNTER — Encounter (HOSPITAL_COMMUNITY)
Admission: EM | Disposition: A | Discharge status: Home / Self Care | Payer: Self-pay | Source: Home / Self Care | Attending: Internal Medicine

## 2021-07-07 ENCOUNTER — Other Ambulatory Visit: Payer: Self-pay

## 2021-07-07 ENCOUNTER — Inpatient Hospital Stay (HOSPITAL_COMMUNITY): Payer: Self-pay | Admitting: Certified Registered Nurse Anesthetist

## 2021-07-07 ENCOUNTER — Inpatient Hospital Stay (HOSPITAL_COMMUNITY): Payer: Self-pay

## 2021-07-07 ENCOUNTER — Encounter (HOSPITAL_COMMUNITY): Payer: Self-pay | Admitting: Internal Medicine

## 2021-07-07 DIAGNOSIS — N200 Calculus of kidney: Secondary | ICD-10-CM

## 2021-07-07 HISTORY — PX: CYSTOSCOPY WITH STENT PLACEMENT: SHX5790

## 2021-07-07 LAB — CBC WITH DIFFERENTIAL/PLATELET
Abs Immature Granulocytes: 0.09 10*3/uL — ABNORMAL HIGH (ref 0.00–0.07)
Basophils Absolute: 0 10*3/uL (ref 0.0–0.1)
Basophils Relative: 0 %
Eosinophils Absolute: 0 10*3/uL (ref 0.0–0.5)
Eosinophils Relative: 0 %
HCT: 39.2 % (ref 36.0–46.0)
Hemoglobin: 12.5 g/dL (ref 12.0–15.0)
Immature Granulocytes: 1 %
Lymphocytes Relative: 8 %
Lymphs Abs: 1.4 10*3/uL (ref 0.7–4.0)
MCH: 25.4 pg — ABNORMAL LOW (ref 26.0–34.0)
MCHC: 31.9 g/dL (ref 30.0–36.0)
MCV: 79.7 fL — ABNORMAL LOW (ref 80.0–100.0)
Monocytes Absolute: 1.1 10*3/uL — ABNORMAL HIGH (ref 0.1–1.0)
Monocytes Relative: 6 %
Neutro Abs: 15.5 10*3/uL — ABNORMAL HIGH (ref 1.7–7.7)
Neutrophils Relative %: 85 %
Platelets: 315 10*3/uL (ref 150–400)
RBC: 4.92 MIL/uL (ref 3.87–5.11)
RDW: 14 % (ref 11.5–15.5)
WBC: 18.1 10*3/uL — ABNORMAL HIGH (ref 4.0–10.5)
nRBC: 0 % (ref 0.0–0.2)

## 2021-07-07 LAB — URINALYSIS, ROUTINE W REFLEX MICROSCOPIC
Bacteria, UA: NONE SEEN
Bilirubin Urine: NEGATIVE
Glucose, UA: NEGATIVE mg/dL
Ketones, ur: 20 mg/dL — AB
Nitrite: NEGATIVE
Protein, ur: 30 mg/dL — AB
RBC / HPF: >50 RBC/hpf — ABNORMAL HIGH (ref 0–5)
Specific Gravity, Urine: 1.019 (ref 1.005–1.030)
WBC, UA: >50 WBC/hpf — ABNORMAL HIGH (ref 0–5)
pH: 9 — ABNORMAL HIGH (ref 5.0–8.0)

## 2021-07-07 LAB — RESP PANEL BY RT-PCR (FLU A&B, COVID) ARPGX2
Influenza A by PCR: NEGATIVE
Influenza B by PCR: NEGATIVE
SARS Coronavirus 2 by RT PCR: NEGATIVE

## 2021-07-07 LAB — HEMOGLOBIN A1C
Hgb A1c MFr Bld: 6.1 % — ABNORMAL HIGH (ref 4.8–5.6)
Mean Plasma Glucose: 128.37 mg/dL

## 2021-07-07 SURGERY — CYSTOSCOPY, WITH STENT INSERTION
Anesthesia: General | Laterality: Right

## 2021-07-07 MED ORDER — OXYCODONE HCL 5 MG/5ML PO SOLN: 5.0000 mg | Freq: Once | ORAL | Status: DC | PRN

## 2021-07-07 MED ORDER — MIDAZOLAM HCL 2 MG/2ML IJ SOLN
INTRAMUSCULAR | Status: AC
  Filled 2021-07-07: qty 2

## 2021-07-07 MED ORDER — SODIUM CHLORIDE 0.9 % IV SOLN
1.0000 g | Freq: Once | INTRAVENOUS | Status: AC
  Administered 2021-07-07: 1 g via INTRAVENOUS
  Filled 2021-07-07: qty 10

## 2021-07-07 MED ORDER — LACTATED RINGERS IV SOLN: INTRAVENOUS | Status: DC

## 2021-07-07 MED ORDER — PROCHLORPERAZINE EDISYLATE 10 MG/2ML IJ SOLN
10.0000 mg | Freq: Four times a day (QID) | INTRAMUSCULAR | Status: DC | PRN
  Administered 2021-07-07: 10 mg via INTRAVENOUS
  Filled 2021-07-07: qty 2

## 2021-07-07 MED ORDER — PROPOFOL 10 MG/ML IV BOLUS
INTRAVENOUS | Status: DC | PRN
  Administered 2021-07-07: 200 mg via INTRAVENOUS

## 2021-07-07 MED ORDER — SODIUM CHLORIDE 0.9 % IV SOLN
INTRAVENOUS | Status: AC
  Filled 2021-07-07: qty 2

## 2021-07-07 MED ORDER — OXYCODONE-ACETAMINOPHEN 5-325 MG PO TABS: 1.0000 | ORAL_TABLET | Freq: Four times a day (QID) | ORAL | 0 refills | Status: DC | PRN

## 2021-07-07 MED ORDER — SODIUM CHLORIDE 0.9 % IV SOLN
1.0000 g | INTRAVENOUS | Status: DC
  Administered 2021-07-08: 1 g via INTRAVENOUS
  Filled 2021-07-07: qty 1

## 2021-07-07 MED ORDER — CHLORHEXIDINE GLUCONATE 0.12 % MT SOLN
15.0000 mL | OROMUCOSAL | Status: AC
  Administered 2021-07-07: 15 mL via OROMUCOSAL

## 2021-07-07 MED ORDER — ONDANSETRON HCL 4 MG/2ML IJ SOLN
INTRAMUSCULAR | Status: DC | PRN
  Administered 2021-07-07: 4 mg via INTRAVENOUS

## 2021-07-07 MED ORDER — IBUPROFEN 600 MG PO TABS: 600.0000 mg | ORAL_TABLET | Freq: Four times a day (QID) | ORAL | 0 refills | Status: AC | PRN

## 2021-07-07 MED ORDER — SODIUM CHLORIDE 0.9 % IR SOLN
Status: DC | PRN
  Administered 2021-07-07: 3000 mL

## 2021-07-07 MED ORDER — PANTOPRAZOLE SODIUM 40 MG IV SOLR
40.0000 mg | INTRAVENOUS | Status: DC
  Administered 2021-07-07: 40 mg via INTRAVENOUS
  Filled 2021-07-07: qty 40

## 2021-07-07 MED ORDER — MORPHINE SULFATE (PF) 2 MG/ML IV SOLN
2.0000 mg | INTRAVENOUS | Status: DC | PRN
  Administered 2021-07-07: 2 mg via INTRAVENOUS
  Filled 2021-07-07: qty 1

## 2021-07-07 MED ORDER — PROPOFOL 10 MG/ML IV BOLUS
INTRAVENOUS | Status: AC
  Filled 2021-07-07: qty 20

## 2021-07-07 MED ORDER — MIDAZOLAM HCL 2 MG/2ML IJ SOLN
INTRAMUSCULAR | Status: DC | PRN
  Administered 2021-07-07: 1 mg via INTRAVENOUS

## 2021-07-07 MED ORDER — OXYCODONE-ACETAMINOPHEN 5-325 MG PO TABS: 1.0000 | ORAL_TABLET | Freq: Four times a day (QID) | ORAL | Status: DC | PRN

## 2021-07-07 MED ORDER — PHENYLEPHRINE HCL (PRESSORS) 10 MG/ML IV SOLN
INTRAVENOUS | Status: AC
  Filled 2021-07-07: qty 1

## 2021-07-07 MED ORDER — FENTANYL CITRATE (PF) 100 MCG/2ML IJ SOLN
INTRAMUSCULAR | Status: DC | PRN
  Administered 2021-07-07: 50 ug via INTRAVENOUS

## 2021-07-07 MED ORDER — DEXAMETHASONE SODIUM PHOSPHATE 10 MG/ML IJ SOLN
INTRAMUSCULAR | Status: DC | PRN
  Administered 2021-07-07: 10 mg via INTRAVENOUS

## 2021-07-07 MED ORDER — OXYCODONE HCL 5 MG PO TABS: 5.0000 mg | ORAL_TABLET | Freq: Once | ORAL | Status: DC | PRN

## 2021-07-07 MED ORDER — TAMSULOSIN HCL 0.4 MG PO CAPS
0.4000 mg | ORAL_CAPSULE | Freq: Every day | ORAL | 0 refills | Status: AC
Start: 2021-07-07 — End: 2021-07-22

## 2021-07-07 MED ORDER — IBUPROFEN 200 MG PO TABS: 600.0000 mg | ORAL_TABLET | Freq: Three times a day (TID) | ORAL | Status: DC

## 2021-07-07 MED ORDER — PROMETHAZINE HCL 25 MG/ML IJ SOLN: 6.2500 mg | INTRAMUSCULAR | Status: DC | PRN

## 2021-07-07 MED ORDER — LACTATED RINGERS IV SOLN: INTRAVENOUS | Status: DC | PRN

## 2021-07-07 MED ORDER — LIDOCAINE 2% (20 MG/ML) 5 ML SYRINGE
INTRAMUSCULAR | Status: DC | PRN
  Administered 2021-07-07: 60 mg via INTRAVENOUS

## 2021-07-07 MED ORDER — FENTANYL CITRATE (PF) 100 MCG/2ML IJ SOLN
INTRAMUSCULAR | Status: AC
  Filled 2021-07-07: qty 2

## 2021-07-07 MED ORDER — FENTANYL CITRATE (PF) 100 MCG/2ML IJ SOLN: 25.0000 ug | INTRAMUSCULAR | Status: DC | PRN

## 2021-07-07 SURGICAL SUPPLY — 12 items
BAG URO CATCHER STRL LF (MISCELLANEOUS) ×2 IMPLANT
CATH INTERMIT  6FR 70CM (CATHETERS) ×2 IMPLANT
CLOTH BEACON ORANGE TIMEOUT ST (SAFETY) ×2 IMPLANT
GLOVE SURG ENC MOIS LTX SZ7.5 (GLOVE) ×2 IMPLANT
GOWN STRL REUS W/TWL LRG LVL3 (GOWN DISPOSABLE) ×4 IMPLANT
GOWN STRL REUS W/TWL XL LVL3 (GOWN DISPOSABLE) ×2 IMPLANT
GUIDEWIRE STR DUAL SENSOR (WIRE) ×2 IMPLANT
KIT TURNOVER KIT A (KITS) ×2 IMPLANT
MANIFOLD NEPTUNE II (INSTRUMENTS) ×2 IMPLANT
PACK CYSTO (CUSTOM PROCEDURE TRAY) ×2 IMPLANT
TUBING CONNECTING 10 (TUBING) ×2 IMPLANT
TUBING UROLOGY SET (TUBING) IMPLANT

## 2021-07-07 NOTE — Consult Note (Addendum)
Urology Consult   Physician requesting consult: Meridee Score  Reason for consult: Nephrolithiasis  History of Present Illness: Lori Young is a 35 y.o. spanish speaking only female with PMH significant for GERD and previous c section who presented to the ED with c/o right flank pain and N/V.  Her flank pain began approx 2 weeks ago and has been intermittent and non radiating.  Her pain progressed last night and was accompanied by N/V which prompted her to come to the ED for eval. She denies recent fevers, chill, and hematuria. In the ED CT scan revealed a 72mm right distal ureteral stone with right sided hydro. WBC 18, Cr 0.72.  UA nitrite negative, >50 RBCs, >50 WBCs, no bacteria.  Pregnancy test negative. Urine culture pending. She was started on Rocephin and pain medication.  IM was consulted for admission and urology for stone treatment.   She is currently resting comfortably and her only complaint is of pain at her IV site.  She denies F/C, HA, CP, SOB, N/V, flank pain, diarrhea/constipation, and dysuria.   She denies a history of voiding or storage urinary symptoms, hematuria, UTIs, STDs, urolithiasis, GU malignancy/trauma/surgery.  All communication was carried out via online interpreter.  PMH: GERD  PSH: c section   Current Hospital Medications:  Home meds:  . Current Meds  Medication Sig   acetaminophen (TYLENOL) 500 MG tablet Take 1,000 mg by mouth every 6 (six) hours as needed for headache.   ibuprofen (ADVIL) 600 MG tablet Take 1 tablet (600 mg total) by mouth every 6 (six) hours as needed for mild pain or moderate pain.   omeprazole (PRILOSEC) 20 MG capsule Take 1 capsule (20 mg total) by mouth daily.   oxyCODONE-acetaminophen (PERCOCET/ROXICET) 5-325 MG tablet Take 1 tablet by mouth every 6 (six) hours as needed for up to 15 doses for severe pain.   tamsulosin (FLOMAX) 0.4 MG CAPS capsule Take 1 capsule (0.4 mg total) by mouth daily after breakfast for 15 days.        Scheduled Meds: Continuous Infusions: PRN Meds:.oxyCODONE-acetaminophen  Allergies:  Allergies  Allergen Reactions   Lactose Intolerance (Gi) Other (See Comments)    Upset stomach    History reviewed. No pertinent family history.  Social History:  reports that she has never smoked. She has never used smokeless tobacco. She reports previous alcohol use. She reports that she does not use drugs.  ROS: A complete review of systems was performed.  All systems are negative except for pertinent findings as noted.  Physical Exam:  Vital signs in last 24 hours: Temp:  [98.4 F (36.9 C)] 98.4 F (36.9 C) (07/12 0124) Pulse Rate:  [48-79] 59 (07/12 1000) Resp:  [16-18] 18 (07/12 1000) BP: (93-109)/(50-85) 100/68 (07/12 1000) SpO2:  [100 %] 100 % (07/12 1000) Weight:  [62 kg] 62 kg (07/11 1902) Constitutional:  Alert and oriented, No acute distress Cardiovascular: Regular rate and rhythm Respiratory: Normal respiratory effort GI: Abdomen is soft, nontender, nondistended, no abdominal masses GU: No CVA tenderness Lymphatic: No lymphadenopathy Neurologic: Grossly intact, no focal deficits Psychiatric: Normal mood and affect  Laboratory Data:  Recent Labs    07/07/21 0106  WBC 18.1*  HGB 12.5  HCT 39.2  PLT 315    Recent Labs    07/06/21 2218  NA 140  K 3.6  CL 105  GLUCOSE 135*  BUN 15  CALCIUM 9.5  CREATININE 0.72     Results for orders placed or performed during the hospital encounter  of 07/06/21 (from the past 24 hour(s))  Basic metabolic panel     Status: Abnormal   Collection Time: 07/06/21 10:18 PM  Result Value Ref Range   Sodium 140 135 - 145 mmol/L   Potassium 3.6 3.5 - 5.1 mmol/L   Chloride 105 98 - 111 mmol/L   CO2 21 (L) 22 - 32 mmol/L   Glucose, Bld 135 (H) 70 - 99 mg/dL   BUN 15 6 - 20 mg/dL   Creatinine, Ser 1.610.72 0.44 - 1.00 mg/dL   Calcium 9.5 8.9 - 09.610.3 mg/dL   GFR, Estimated >04>60 >54>60 mL/min   Anion gap 14 5 - 15  I-Stat Beta hCG  blood, ED ([facility], [facility], [facility] only)     Status: None   Collection Time: 07/06/21 10:51 PM  Result Value Ref Range   I-stat hCG, quantitative <5.0 <5 mIU/mL   Comment 3          CBC with Differential/Platelet     Status: Abnormal   Collection Time: 07/07/21  1:06 AM  Result Value Ref Range   WBC 18.1 (H) 4.0 - 10.5 K/uL   RBC 4.92 3.87 - 5.11 MIL/uL   Hemoglobin 12.5 12.0 - 15.0 g/dL   HCT 09.839.2 11.936.0 - 14.746.0 %   MCV 79.7 (L) 80.0 - 100.0 fL   MCH 25.4 (L) 26.0 - 34.0 pg   MCHC 31.9 30.0 - 36.0 g/dL   RDW 82.914.0 56.211.5 - 13.015.5 %   Platelets 315 150 - 400 K/uL   nRBC 0.0 0.0 - 0.2 %   Neutrophils Relative % 85 %   Neutro Abs 15.5 (H) 1.7 - 7.7 K/uL   Lymphocytes Relative 8 %   Lymphs Abs 1.4 0.7 - 4.0 K/uL   Monocytes Relative 6 %   Monocytes Absolute 1.1 (H) 0.1 - 1.0 K/uL   Eosinophils Relative 0 %   Eosinophils Absolute 0.0 0.0 - 0.5 K/uL   Basophils Relative 0 %   Basophils Absolute 0.0 0.0 - 0.1 K/uL   Immature Granulocytes 1 %   Abs Immature Granulocytes 0.09 (H) 0.00 - 0.07 K/uL  Urinalysis, Routine w reflex microscopic     Status: Abnormal   Collection Time: 07/07/21  1:20 AM  Result Value Ref Range   Color, Urine YELLOW YELLOW   APPearance TURBID (A) CLEAR   Specific Gravity, Urine 1.019 1.005 - 1.030   pH 9.0 (H) 5.0 - 8.0   Glucose, UA NEGATIVE NEGATIVE mg/dL   Hgb urine dipstick SMALL (A) NEGATIVE   Bilirubin Urine NEGATIVE NEGATIVE   Ketones, ur 20 (A) NEGATIVE mg/dL   Protein, ur 30 (A) NEGATIVE mg/dL   Nitrite NEGATIVE NEGATIVE   Leukocytes,Ua MODERATE (A) NEGATIVE   RBC / HPF >50 (H) 0 - 5 RBC/hpf   WBC, UA >50 (H) 0 - 5 WBC/hpf   Bacteria, UA NONE SEEN NONE SEEN   Squamous Epithelial / LPF 6-10 0 - 5   Mucus PRESENT    Amorphous Crystal PRESENT   Resp Panel by RT-PCR (Flu A&B, Covid) Nasopharyngeal Swab     Status: None   Collection Time: 07/07/21  9:56 AM   Specimen: Nasopharyngeal Swab; Nasopharyngeal(NP) swabs in vial transport medium  Result Value Ref Range    SARS Coronavirus 2 by RT PCR NEGATIVE NEGATIVE   Influenza A by PCR NEGATIVE NEGATIVE   Influenza B by PCR NEGATIVE NEGATIVE   Recent Results (from the past 240 hour(s))  Resp Panel by RT-PCR (Flu A&B, Covid) Nasopharyngeal Swab  Status: None   Collection Time: 07/07/21  9:56 AM   Specimen: Nasopharyngeal Swab; Nasopharyngeal(NP) swabs in vial transport medium  Result Value Ref Range Status   SARS Coronavirus 2 by RT PCR NEGATIVE NEGATIVE Final    Comment: (NOTE) SARS-CoV-2 target nucleic acids are NOT DETECTED.  The SARS-CoV-2 RNA is generally detectable in upper respiratory specimens during the acute phase of infection. The lowest concentration of SARS-CoV-2 viral copies this assay can detect is 138 copies/mL. A negative result does not preclude SARS-Cov-2 infection and should not be used as the sole basis for treatment or other patient management decisions. A negative result may occur with  improper specimen collection/handling, submission of specimen other than nasopharyngeal swab, presence of viral mutation(s) within the areas targeted by this assay, and inadequate number of viral copies(<138 copies/mL). A negative result must be combined with clinical observations, patient history, and epidemiological information. The expected result is Negative.  Fact Sheet for Patients:  BloggerCourse.com  Fact Sheet for Healthcare Providers:  SeriousBroker.it  This test is no t yet approved or cleared by the [place] FDA and  has been authorized for detection and/or diagnosis of SARS-CoV-2 by FDA under an Emergency Use Authorization (EUA). This EUA will remain  in effect (meaning this test can be used) for the duration of the COVID-19 declaration under Section 564(b)(1) of the Act, 21 U.S.C.section 360bbb-3(b)(1), unless the authorization is terminated  or revoked sooner.       Influenza A by PCR NEGATIVE NEGATIVE Final    Influenza B by PCR NEGATIVE NEGATIVE Final    Comment: (NOTE) The Xpert Xpress SARS-CoV-2/FLU/RSV plus assay is intended as an aid in the diagnosis of influenza from Nasopharyngeal swab specimens and should not be used as a sole basis for treatment. Nasal washings and aspirates are unacceptable for Xpert Xpress SARS-CoV-2/FLU/RSV testing.  Fact Sheet for Patients: BloggerCourse.com  Fact Sheet for Healthcare Providers: SeriousBroker.it  This test is not yet approved or cleared by the [place] FDA and has been authorized for detection and/or diagnosis of SARS-CoV-2 by FDA under an Emergency Use Authorization (EUA). This EUA will remain in effect (meaning this test can be used) for the duration of the COVID-19 declaration under Section 564(b)(1) of the Act, 21 U.S.C. section 360bbb-3(b)(1), unless the authorization is terminated or revoked.  Performed at [facility], [address], [place], [place] [address]     Renal Function: Recent Labs    07/06/21 2218  CREATININE 0.72   Estimated Creatinine Clearance: 75.8 mL/min (by C-G formula based on SCr of 0.72 mg/dL).  Radiologic Imaging: CT Renal Stone Study  Result Date: 07/06/2021 CLINICAL DATA:  35 year old female with flank pain. Concern for kidney stone. EXAM: CT ABDOMEN AND PELVIS WITHOUT CONTRAST TECHNIQUE: Multidetector CT imaging of the abdomen and pelvis was performed following the standard protocol without IV contrast. COMPARISON:  None. FINDINGS: Evaluation of this exam is limited in the absence of intravenous contrast. Lower chest: Right lung base atelectasis. The visualized lung bases are otherwise clear. No intra-abdominal free air or free fluid. Hepatobiliary: The liver is unremarkable. No intrahepatic biliary ductal dilatation. Several noncalcified stones noted within the gallbladder. No pericholecystic fluid or evidence of acute cholecystitis  by CT. Ultrasound may provide better evaluation of the gallbladder if clinically indicated. Pancreas: Unremarkable. No pancreatic ductal dilatation or surrounding inflammatory changes. Spleen: Normal in size without focal abnormality. Adrenals/Urinary Tract: The adrenal glands are unremarkable. There is a 9 mm stone in the distal right  ureter with mild right hydronephroureter. The left kidney, left ureter, and urinary bladder appear unremarkable. Stomach/Bowel: There is no bowel obstruction or active inflammation. The appendix is normal. Vascular/Lymphatic: The abdominal aorta and IVC are grossly unremarkable on this noncontrast CT. No portal venous gas. There is no adenopathy. Reproductive: The uterus is anteverted. No adnexal masses. Other: None Musculoskeletal: No acute or significant osseous findings. IMPRESSION: 1. A 9 mm distal right ureteral stone with mild right hydronephroureter. 2. Cholelithiasis. Electronically Signed   By: Elgie Collard M.D.   On: 07/06/2021 23:41    Impression/Recommendation: Right nephrolithiasis with hydroureteronephrosis--Dr. Alvester Morin will take the pt to the OR later today for cysto with right ureteral stent placement and possible laser litho of the stone. She is being admitted by hospitalists.   NPO  Continue IVF, pain medications, and anti-emetics  Harrie Foreman 07/07/2021, 12:02 PM    Agree with assessment and plan except as noted: Patient with leukocytosis and obstructing right ureteral stone  Patient is in no acute distress.  She has received Rocephin  Plan for urgent right ureteral stent placement.  She will need definitive management for the stone in a couple of weeks.

## 2021-07-07 NOTE — ED Provider Notes (Signed)
12:58 AM Care assumed from Dr. Renaye Rakers.  At time of transfer care, patient awaiting reassessment in the morning to determine if she is maintaining hydration with the persistent nausea and vomiting related to a 9 mm kidney stone was discovered on work-up today.  As she will likely have significant difficulties managing [place] with outpatient follow-up, plan is to let her rest and treat the nausea and vomiting to see if she will be able to go home in the morning.  If she is having nausea and vomiting, plan will be to consult urology for evaluation and management of this large kidney stone.  Anticipate reassessment by day team in the morning   Lori Young, Canary Brim, MD 07/07/21 [phone]

## 2021-07-07 NOTE — Anesthesia Procedure Notes (Addendum)
Procedure Name: LMA Insertion Date/Time: 07/07/2021 5:46 PM Performed by: Minerva Ends, CRNA Pre-anesthesia Checklist: Patient identified, Emergency Drugs available, Suction available and Patient being monitored Patient Re-evaluated:Patient Re-evaluated prior to induction Oxygen Delivery Method: Circle System Utilized Preoxygenation: Pre-oxygenation with 100% oxygen Induction Type: IV induction Ventilation: Mask ventilation without difficulty LMA: LMA inserted and LMA with gastric port inserted LMA Size: 3.0 Number of attempts: 1 Placement Confirmation: positive ETCO2 Tube secured with: Tape Dental Injury: Teeth and Oropharynx as per pre-operative assessment  Comments: Smooth IV induction Dr Mal Amabile- LMA AM CRNA atraumatic- teeth and mouth as preop bilat BS

## 2021-07-07 NOTE — H&P (Signed)
History and Physical    Lori Young JXB:[id number] DOB: 1986/01/25 DOA: 07/06/2021  PCP: Patient, No Pcp Per (Inactive)  Patient coming from: Home  Chief Complaint: back  HPI: Lori Young is a 35 y.o. female with medical history significant of GERD. Presents w/ 2 weeks of right flank pain. It's episodic, stabbing and non-radiating. She saw a physician for these symptoms and was given pain meds and told to stay hydrated. She was doing ok until yesterday afternoon. She had and intense flash of pain in the right flank. She had nausea and vomiting. It continued through the evening. She decided to come to the ED for help. Of note, she reports changes in her urine. She has pain with urination, darker urine and increased frequency. She denies any other aggravating or alleviating factors.   ED Course: CT showed 72mm distal right ureteral stone w/ mild right hydronephroureter. She was started on rocephin and pain control. Urology was consulted. [facility] was called for admission.   Review of Systems:  Denies CP, dyspnea, palpitations, syncopal episodes, diarrhea, fever, HA. Review of systems is otherwise negative for all not mentioned in HPI.   PMHx GERD  PSHx Previous C-section  SocHx  reports that she has never smoked. She has never used smokeless tobacco. She reports previous alcohol use. She reports that she does not use drugs.  Allergies  Allergen Reactions   Lactose Intolerance (Gi) Other (See Comments)    Upset stomach    FamHx History reviewed. No pertinent family history.  Prior to Admission medications   Medication Sig Start Date End Date Taking? Authorizing Provider  ibuprofen (ADVIL) 600 MG tablet Take 1 tablet (600 mg total) by mouth every 6 (six) hours as needed for mild pain or moderate pain. 07/07/21 08/06/21 Yes Trifan, Kermit Balo, MD  oxyCODONE-acetaminophen (PERCOCET/ROXICET) 5-325 MG tablet Take 1 tablet by mouth every 6 (six) hours as needed for up to 15 doses for severe  pain. 07/07/21  Yes Trifan, Kermit Balo, MD  tamsulosin (FLOMAX) 0.4 MG CAPS capsule Take 1 capsule (0.4 mg total) by mouth daily after breakfast for 15 days. 07/07/21 07/22/21 Yes Trifan, Kermit Balo, MD  acetaminophen (TYLENOL) 500 MG tablet Take 1,000 mg by mouth every 6 (six) hours as needed for headache.    [provider]  omeprazole (PRILOSEC) 20 MG capsule Take 1 capsule (20 mg total) by mouth daily. 03/11/21   Grayce Sessions, NP    Physical Exam: Vitals:   07/07/21 0700 07/07/21 0830 07/07/21 0900 07/07/21 1000  BP: 96/61 103/65 109/66 100/68  Pulse: (!) 50 (!) 48 (!) 54 (!) 59  Resp: 16 18 16 18   Temp:      TempSrc:      SpO2: 100% 100% 100% 100%  Weight:      Height:        General: 35 y.o. female resting in bed in NAD Eyes: PERRL, normal sclera ENMT: Nares patent w/o discharge, orophaynx clear, dentition normal, ears w/o discharge/lesions/ulcers Neck: Supple, trachea midline Cardiovascular: RRR, +S1, S2, no m/g/r, equal pulses throughout Respiratory: CTABL, no w/r/r, normal WOB GI: BS+, NDNT, no masses noted, no organomegaly noted MSK: No e/c/c; R CVAT Skin: No rashes, bruises, ulcerations noted Neuro: A&O x 3, no focal deficits Psyc: Appropriate interaction and affect, calm/cooperative  Labs on Admission: I have personally reviewed following labs and imaging studies  CBC: Recent Labs  Lab 07/07/21 0106  WBC 18.1*  NEUTROABS 15.5*  HGB 12.5  HCT 39.2  MCV 79.7*  PLT 315   Basic Metabolic Panel: Recent Labs  Lab 07/06/21 2218  NA 140  K 3.6  CL 105  CO2 21*  GLUCOSE 135*  BUN 15  CREATININE 0.72  CALCIUM 9.5   GFR: Estimated Creatinine Clearance: 75.8 mL/min (by C-G formula based on SCr of 0.72 mg/dL). Liver Function Tests: No results for input(s): AST, ALT, ALKPHOS, BILITOT, PROT, ALBUMIN in the last 168 hours. No results for input(s): LIPASE, AMYLASE in the last 168 hours. No results for input(s): AMMONIA in the last 168  hours. Coagulation Profile: No results for input(s): INR, PROTIME in the last 168 hours. Cardiac Enzymes: No results for input(s): CKTOTAL, CKMB, CKMBINDEX, TROPONINI in the last 168 hours. BNP (last 3 results) No results for input(s): PROBNP in the last 8760 hours. HbA1C: No results for input(s): HGBA1C in the last 72 hours. CBG: No results for input(s): GLUCAP in the last 168 hours. Lipid Profile: No results for input(s): CHOL, HDL, LDLCALC, TRIG, CHOLHDL, LDLDIRECT in the last 72 hours. Thyroid Function Tests: No results for input(s): TSH, T4TOTAL, FREET4, T3FREE, THYROIDAB in the last 72 hours. Anemia Panel: No results for input(s): VITAMINB12, FOLATE, FERRITIN, TIBC, IRON, RETICCTPCT in the last 72 hours. Urine analysis:    Component Value Date/Time   COLORURINE YELLOW 07/07/2021 0120   APPEARANCEUR TURBID (A) 07/07/2021 0120   LABSPEC 1.019 07/07/2021 0120   PHURINE 9.0 (H) 07/07/2021 0120   GLUCOSEU NEGATIVE 07/07/2021 0120   HGBUR SMALL (A) 07/07/2021 0120   BILIRUBINUR NEGATIVE 07/07/2021 0120   KETONESUR 20 (A) 07/07/2021 0120   PROTEINUR 30 (A) 07/07/2021 0120   NITRITE NEGATIVE 07/07/2021 0120   LEUKOCYTESUR MODERATE (A) 07/07/2021 0120    Radiological Exams on Admission: CT Renal Stone Study  Result Date: 07/06/2021 CLINICAL DATA:  35 year old female with flank pain. Concern for kidney stone. EXAM: CT ABDOMEN AND PELVIS WITHOUT CONTRAST TECHNIQUE: Multidetector CT imaging of the abdomen and pelvis was performed following the standard protocol without IV contrast. COMPARISON:  None. FINDINGS: Evaluation of this exam is limited in the absence of intravenous contrast. Lower chest: Right lung base atelectasis. The visualized lung bases are otherwise clear. No intra-abdominal free air or free fluid. Hepatobiliary: The liver is unremarkable. No intrahepatic biliary ductal dilatation. Several noncalcified stones noted within the gallbladder. No pericholecystic fluid or  evidence of acute cholecystitis by CT. Ultrasound may provide better evaluation of the gallbladder if clinically indicated. Pancreas: Unremarkable. No pancreatic ductal dilatation or surrounding inflammatory changes. Spleen: Normal in size without focal abnormality. Adrenals/Urinary Tract: The adrenal glands are unremarkable. There is a 9 mm stone in the distal right ureter with mild right hydronephroureter. The left kidney, left ureter, and urinary bladder appear unremarkable. Stomach/Bowel: There is no bowel obstruction or active inflammation. The appendix is normal. Vascular/Lymphatic: The abdominal aorta and IVC are grossly unremarkable on this noncontrast CT. No portal venous gas. There is no adenopathy. Reproductive: The uterus is anteverted. No adnexal masses. Other: None Musculoskeletal: No acute or significant osseous findings. IMPRESSION: 1. A 9 mm distal right ureteral stone with mild right hydronephroureter. 2. Cholelithiasis. Electronically Signed   By: Elgie Collard M.D.   On: 07/06/2021 23:41    EKG: None obtained in ED  Assessment/Plan UTI Obstructive uropathy w/ 57mm ureteral stone     - admit to inpt, med-surg     - CT results as above     - continue rocephin     - NPO for now     -  fluids, anti-emetics, pain control     - strain urine     - urology to see  GERD     - protonix  Hyperglycemia     - no hx of DM, check A1c  DVT prophylaxis: SCDs  Code Status: FULL   Family Communication: None at bedside  Consults called: EDP spoke with urology   Status is: Inpatient  Remains inpatient appropriate because:Inpatient level of care appropriate due to severity of illness  Dispo: The patient is from: Home              Anticipated d/c is to: Home              Patient currently is medically stable to d/c.   Difficult to place patient No  Time spent coordinating admission: 50 minutes  Tannen Vandezande A Yuleimy Kretz DO [facility]  If 7PM-7AM, please contact  night-coverage [email or web address]  07/07/2021, 10:37 AM

## 2021-07-07 NOTE — ED Provider Notes (Signed)
Signout from Dr. Rush Landmark.  35 year old here with flank pain nausea vomiting.  Found to have a 9 mm distal stone.  Urinalysis with possible signs of infection.  Plan is to reassess to see if her symptoms are controlled well enough for her to manage outpatient. Physical Exam  BP 96/61   Pulse (!) 50   Temp 98.4 F (36.9 C) (Oral)   Resp 16   Ht 4\' 9"  (1.448 m)   Wt 62 kg   LMP  (Approximate)   SpO2 100%   BMI 29.58 kg/m   Physical Exam  ED Course/Procedures   Clinical Course as of 07/07/21 1650  Mon Jul 06, 2021  2344 IMPRESSION: 1. A 9 mm distal right ureteral stone with mild right hydronephroureter. 2. Cholelithiasis. [MT]    Clinical Course User Index [MT] Trifan, [id number], MD    Procedures  MDM  8:30 AM.  Patient states her pain is 0.  No nausea or vomiting.  Will get urine culture give IV dose of antibiotics.  Still unclear if appropriate for outpatient management.   9:30 AM.  Discussed with urology Dr. Kermit Balo.  He recommends keeping the patient n.p.o. and medicine admission.  He will probably stent her in the afternoon.  Hospitalist has been paged.  Discussed with [facility] hospitalist Dr. Alvester Morin who will evaluate the patient for admission.       Ronaldo Miyamoto, MD 07/07/21 [phone]

## 2021-07-07 NOTE — H&P (View-Only) (Signed)
Urology Consult   Physician requesting consult: Meridee Score  Reason for consult: Nephrolithiasis  History of Present Illness: Jenefer Woerner is a 35 y.o. spanish speaking only female with PMH significant for GERD and previous c section who presented to the ED with c/o right flank pain and N/V.  Her flank pain began approx 2 weeks ago and has been intermittent and non radiating.  Her pain progressed last night and was accompanied by N/V which prompted her to come to the ED for eval. She denies recent fevers, chill, and hematuria. In the ED CT scan revealed a 72mm right distal ureteral stone with right sided hydro. WBC 18, Cr 0.72.  UA nitrite negative, >50 RBCs, >50 WBCs, no bacteria.  Pregnancy test negative. Urine culture pending. She was started on Rocephin and pain medication.  IM was consulted for admission and urology for stone treatment.   She is currently resting comfortably and her only complaint is of pain at her IV site.  She denies F/C, HA, CP, SOB, N/V, flank pain, diarrhea/constipation, and dysuria.   She denies a history of voiding or storage urinary symptoms, hematuria, UTIs, STDs, urolithiasis, GU malignancy/trauma/surgery.  All communication was carried out via online interpreter.  PMH: GERD  PSH: c section   Current Hospital Medications:  Home meds:  . Current Meds  Medication Sig   acetaminophen (TYLENOL) 500 MG tablet Take 1,000 mg by mouth every 6 (six) hours as needed for headache.   ibuprofen (ADVIL) 600 MG tablet Take 1 tablet (600 mg total) by mouth every 6 (six) hours as needed for mild pain or moderate pain.   omeprazole (PRILOSEC) 20 MG capsule Take 1 capsule (20 mg total) by mouth daily.   oxyCODONE-acetaminophen (PERCOCET/ROXICET) 5-325 MG tablet Take 1 tablet by mouth every 6 (six) hours as needed for up to 15 doses for severe pain.   tamsulosin (FLOMAX) 0.4 MG CAPS capsule Take 1 capsule (0.4 mg total) by mouth daily after breakfast for 15 days.        Scheduled Meds: Continuous Infusions: PRN Meds:.oxyCODONE-acetaminophen  Allergies:  Allergies  Allergen Reactions   Lactose Intolerance (Gi) Other (See Comments)    Upset stomach    History reviewed. No pertinent family history.  Social History:  reports that she has never smoked. She has never used smokeless tobacco. She reports previous alcohol use. She reports that she does not use drugs.  ROS: A complete review of systems was performed.  All systems are negative except for pertinent findings as noted.  Physical Exam:  Vital signs in last 24 hours: Temp:  [98.4 F (36.9 C)] 98.4 F (36.9 C) (07/12 0124) Pulse Rate:  [48-79] 59 (07/12 1000) Resp:  [16-18] 18 (07/12 1000) BP: (93-109)/(50-85) 100/68 (07/12 1000) SpO2:  [100 %] 100 % (07/12 1000) Weight:  [62 kg] 62 kg (07/11 1902) Constitutional:  Alert and oriented, No acute distress Cardiovascular: Regular rate and rhythm Respiratory: Normal respiratory effort GI: Abdomen is soft, nontender, nondistended, no abdominal masses GU: No CVA tenderness Lymphatic: No lymphadenopathy Neurologic: Grossly intact, no focal deficits Psychiatric: Normal mood and affect  Laboratory Data:  Recent Labs    07/07/21 0106  WBC 18.1*  HGB 12.5  HCT 39.2  PLT 315    Recent Labs    07/06/21 2218  NA 140  K 3.6  CL 105  GLUCOSE 135*  BUN 15  CALCIUM 9.5  CREATININE 0.72     Results for orders placed or performed during the hospital encounter  of 07/06/21 (from the past 24 hour(s))  Basic metabolic panel     Status: Abnormal   Collection Time: 07/06/21 10:18 PM  Result Value Ref Range   Sodium 140 135 - 145 mmol/L   Potassium 3.6 3.5 - 5.1 mmol/L   Chloride 105 98 - 111 mmol/L   CO2 21 (L) 22 - 32 mmol/L   Glucose, Bld 135 (H) 70 - 99 mg/dL   BUN 15 6 - 20 mg/dL   Creatinine, Ser 0.72 0.44 - 1.00 mg/dL   Calcium 9.5 8.9 - 10.3 mg/dL   GFR, Estimated >60 >60 mL/min   Anion gap 14 5 - 15  I-Stat Beta hCG  blood, ED ([facility], [facility], [facility] only)     Status: None   Collection Time: 07/06/21 10:51 PM  Result Value Ref Range   I-stat hCG, quantitative <5.0 <5 mIU/mL   Comment 3          CBC with Differential/Platelet     Status: Abnormal   Collection Time: 07/07/21  1:06 AM  Result Value Ref Range   WBC 18.1 (H) 4.0 - 10.5 K/uL   RBC 4.92 3.87 - 5.11 MIL/uL   Hemoglobin 12.5 12.0 - 15.0 g/dL   HCT 39.2 36.0 - 46.0 %   MCV 79.7 (L) 80.0 - 100.0 fL   MCH 25.4 (L) 26.0 - 34.0 pg   MCHC 31.9 30.0 - 36.0 g/dL   RDW 14.0 11.5 - 15.5 %   Platelets 315 150 - 400 K/uL   nRBC 0.0 0.0 - 0.2 %   Neutrophils Relative % 85 %   Neutro Abs 15.5 (H) 1.7 - 7.7 K/uL   Lymphocytes Relative 8 %   Lymphs Abs 1.4 0.7 - 4.0 K/uL   Monocytes Relative 6 %   Monocytes Absolute 1.1 (H) 0.1 - 1.0 K/uL   Eosinophils Relative 0 %   Eosinophils Absolute 0.0 0.0 - 0.5 K/uL   Basophils Relative 0 %   Basophils Absolute 0.0 0.0 - 0.1 K/uL   Immature Granulocytes 1 %   Abs Immature Granulocytes 0.09 (H) 0.00 - 0.07 K/uL  Urinalysis, Routine w reflex microscopic     Status: Abnormal   Collection Time: 07/07/21  1:20 AM  Result Value Ref Range   Color, Urine YELLOW YELLOW   APPearance TURBID (A) CLEAR   Specific Gravity, Urine 1.019 1.005 - 1.030   pH 9.0 (H) 5.0 - 8.0   Glucose, UA NEGATIVE NEGATIVE mg/dL   Hgb urine dipstick SMALL (A) NEGATIVE   Bilirubin Urine NEGATIVE NEGATIVE   Ketones, ur 20 (A) NEGATIVE mg/dL   Protein, ur 30 (A) NEGATIVE mg/dL   Nitrite NEGATIVE NEGATIVE   Leukocytes,Ua MODERATE (A) NEGATIVE   RBC / HPF >50 (H) 0 - 5 RBC/hpf   WBC, UA >50 (H) 0 - 5 WBC/hpf   Bacteria, UA NONE SEEN NONE SEEN   Squamous Epithelial / LPF 6-10 0 - 5   Mucus PRESENT    Amorphous Crystal PRESENT   Resp Panel by RT-PCR (Flu A&B, Covid) Nasopharyngeal Swab     Status: None   Collection Time: 07/07/21  9:56 AM   Specimen: Nasopharyngeal Swab; Nasopharyngeal(NP) swabs in vial transport medium  Result Value Ref Range    SARS Coronavirus 2 by RT PCR NEGATIVE NEGATIVE   Influenza A by PCR NEGATIVE NEGATIVE   Influenza B by PCR NEGATIVE NEGATIVE   Recent Results (from the past 240 hour(s))  Resp Panel by RT-PCR (Flu A&B, Covid) Nasopharyngeal Swab       Status: None   Collection Time: 07/07/21  9:56 AM   Specimen: Nasopharyngeal Swab; Nasopharyngeal(NP) swabs in vial transport medium  Result Value Ref Range Status   SARS Coronavirus 2 by RT PCR NEGATIVE NEGATIVE Final    Comment: (NOTE) SARS-CoV-2 target nucleic acids are NOT DETECTED.  The SARS-CoV-2 RNA is generally detectable in upper respiratory specimens during the acute phase of infection. The lowest concentration of SARS-CoV-2 viral copies this assay can detect is 138 copies/mL. A negative result does not preclude SARS-Cov-2 infection and should not be used as the sole basis for treatment or other patient management decisions. A negative result may occur with  improper specimen collection/handling, submission of specimen other than nasopharyngeal swab, presence of viral mutation(s) within the areas targeted by this assay, and inadequate number of viral copies(<138 copies/mL). A negative result must be combined with clinical observations, patient history, and epidemiological information. The expected result is Negative.  Fact Sheet for Patients:  BloggerCourse.com  Fact Sheet for Healthcare Providers:  SeriousBroker.it  This test is no t yet approved or cleared by the [place] FDA and  has been authorized for detection and/or diagnosis of SARS-CoV-2 by FDA under an Emergency Use Authorization (EUA). This EUA will remain  in effect (meaning this test can be used) for the duration of the COVID-19 declaration under Section 564(b)(1) of the Act, 21 U.S.C.section 360bbb-3(b)(1), unless the authorization is terminated  or revoked sooner.       Influenza A by PCR NEGATIVE NEGATIVE Final    Influenza B by PCR NEGATIVE NEGATIVE Final    Comment: (NOTE) The Xpert Xpress SARS-CoV-2/FLU/RSV plus assay is intended as an aid in the diagnosis of influenza from Nasopharyngeal swab specimens and should not be used as a sole basis for treatment. Nasal washings and aspirates are unacceptable for Xpert Xpress SARS-CoV-2/FLU/RSV testing.  Fact Sheet for Patients: BloggerCourse.com  Fact Sheet for Healthcare Providers: SeriousBroker.it  This test is not yet approved or cleared by the [place] FDA and has been authorized for detection and/or diagnosis of SARS-CoV-2 by FDA under an Emergency Use Authorization (EUA). This EUA will remain in effect (meaning this test can be used) for the duration of the COVID-19 declaration under Section 564(b)(1) of the Act, 21 U.S.C. section 360bbb-3(b)(1), unless the authorization is terminated or revoked.  Performed at [facility], [address], [place], [place] [address]     Renal Function: Recent Labs    07/06/21 2218  CREATININE 0.72   Estimated Creatinine Clearance: 75.8 mL/min (by C-G formula based on SCr of 0.72 mg/dL).  Radiologic Imaging: CT Renal Stone Study  Result Date: 07/06/2021 CLINICAL DATA:  35 year old female with flank pain. Concern for kidney stone. EXAM: CT ABDOMEN AND PELVIS WITHOUT CONTRAST TECHNIQUE: Multidetector CT imaging of the abdomen and pelvis was performed following the standard protocol without IV contrast. COMPARISON:  None. FINDINGS: Evaluation of this exam is limited in the absence of intravenous contrast. Lower chest: Right lung base atelectasis. The visualized lung bases are otherwise clear. No intra-abdominal free air or free fluid. Hepatobiliary: The liver is unremarkable. No intrahepatic biliary ductal dilatation. Several noncalcified stones noted within the gallbladder. No pericholecystic fluid or evidence of acute cholecystitis  by CT. Ultrasound may provide better evaluation of the gallbladder if clinically indicated. Pancreas: Unremarkable. No pancreatic ductal dilatation or surrounding inflammatory changes. Spleen: Normal in size without focal abnormality. Adrenals/Urinary Tract: The adrenal glands are unremarkable. There is a 9 mm stone in the distal right  ureter with mild right hydronephroureter. The left kidney, left ureter, and urinary bladder appear unremarkable. Stomach/Bowel: There is no bowel obstruction or active inflammation. The appendix is normal. Vascular/Lymphatic: The abdominal aorta and IVC are grossly unremarkable on this noncontrast CT. No portal venous gas. There is no adenopathy. Reproductive: The uterus is anteverted. No adnexal masses. Other: None Musculoskeletal: No acute or significant osseous findings. IMPRESSION: 1. A 9 mm distal right ureteral stone with mild right hydronephroureter. 2. Cholelithiasis. Electronically Signed   By: Elgie Collard M.D.   On: 07/06/2021 23:41    Impression/Recommendation: Right nephrolithiasis with hydroureteronephrosis--Dr. Alvester Morin will take the pt to the OR later today for cysto with right ureteral stent placement and possible laser litho of the stone. She is being admitted by hospitalists.   NPO  Continue IVF, pain medications, and anti-emetics  Harrie Foreman 07/07/2021, 12:02 PM    Agree with assessment and plan except as noted: Patient with leukocytosis and obstructing right ureteral stone  Patient is in no acute distress.  She has received Rocephin  Plan for urgent right ureteral stent placement.  She will need definitive management for the stone in a couple of weeks.

## 2021-07-07 NOTE — ED Notes (Addendum)
Pt walked to restroom with standby assist. 

## 2021-07-07 NOTE — Op Note (Signed)
Operative Note  Preoperative diagnosis:  1.  Right ureteral calculus with possible UTI  Post operative diagnosis: 1.  Right ureteral calculus  Procedure(s): 1.  Cystoscopy with right retrograde pyelogram and right ureteral stent placement  Surgeon: Modena Slater, MD  Assistants: None  Anesthesia: General  Complications: None immediate  EBL: Minimal  Specimens: 1.  None  Drains/Catheters: 1.  6 X 24 double-J ureteral stent  Intraoperative findings: 1.  Normal urethra and bladder 2.  Right retrograde pyelogram revealed a filling defect at the level of the stone with upstream hydroureteronephrosis  Indication: 35 year old female with an obstructing right ureteral stone and leukocytosis and concern for UTI presents for urgent ureteral stent placement.  Description of procedure:  The patient was identified and consent was obtained.  The patient was taken to the operating room and placed in the supine position.  The patient was placed under general anesthesia.  Perioperative antibiotics were administered earlier in the emergency department.  The patient was placed in dorsal lithotomy.  Patient was prepped and draped in a standard sterile fashion and a timeout was performed.  A 21 French rigid cystoscope was advanced into the urethra and into the bladder.  The right distal most portion of the ureter was cannulated with an open-ended ureteral catheter.  Retrograde pyelogram was performed with the findings noted above.  A sensor wire was then advanced up to the kidney under fluoroscopic guidance.  A 6 X 24 double-J ureteral stent was advanced up to the kidney under fluoroscopic guidance.  The wire was withdrawn and fluoroscopy confirmed good proximal placement and direct visualization confirmed a good coil within the bladder.  The bladder was drained and the scope withdrawn.  This concluded the operation.  Patient tolerated procedure well and was stable postoperatively.  Plan: Observe  overnight.  May be able to transition to an oral antibiotic if stable overnight.  Follow-up urine culture.  She will need to be scheduled for right ureteroscopy with laser lithotripsy and ureteral stent placement in a couple of weeks or so.

## 2021-07-07 NOTE — Transfer of Care (Signed)
Immediate Anesthesia Transfer of Care Note  Patient: Lori Young  Procedure(s) Performed: CYSTOSCOPY WITH STENT PLACEMENT (Right)  Patient Location: PACU  Anesthesia Type:General  Level of Consciousness: sedated  Airway & Oxygen Therapy: Patient Spontanous Breathing and Patient connected to face mask oxygen  Post-op Assessment: Report given to RN and Post -op Vital signs reviewed and stable  Post vital signs: Reviewed and stable  Last Vitals:  Vitals Value Taken Time  BP    Temp    Pulse    Resp    SpO2      Last Pain:  Vitals:   07/07/21 1621  TempSrc:   PainSc: 0-No pain         Complications: No notable events documented.

## 2021-07-07 NOTE — Anesthesia Postprocedure Evaluation (Signed)
Anesthesia Post Note  Patient: Lori Young  Procedure(s) Performed: CYSTOSCOPY WITH STENT PLACEMENT (Right)     Patient location during evaluation: PACU Anesthesia Type: General Level of consciousness: awake and alert Pain management: pain level controlled Vital Signs Assessment: post-procedure vital signs reviewed and stable Respiratory status: spontaneous breathing, nonlabored ventilation and respiratory function stable Cardiovascular status: stable and blood pressure returned to baseline Anesthetic complications: no   No notable events documented.  Last Vitals:  Vitals:   07/07/21 1845 07/07/21 1919  BP: 113/62 109/67  Pulse: (!) 51 60  Resp: 20 18  Temp:  36.4 C  SpO2: 95% 100%    Last Pain:  Vitals:   07/07/21 1919  TempSrc: Oral  PainSc:                  Beryle Lathe

## 2021-07-07 NOTE — Anesthesia Preprocedure Evaluation (Addendum)
Anesthesia Evaluation  Patient identified by MRN, date of birth, ID band Patient awake    Reviewed: Allergy & Precautions, NPO status , Patient's Chart, lab work & pertinent test results  History of Anesthesia Complications Negative for: history of anesthetic complications  Airway Mallampati: III  TM Distance: >3 FB Neck ROM: Full    Dental  (+) Dental Advisory Given, Teeth Intact   Pulmonary neg pulmonary ROS,    Pulmonary exam normal        Cardiovascular negative cardio ROS Normal cardiovascular exam     Neuro/Psych  Headaches, negative psych ROS   GI/Hepatic Neg liver ROS, GERD  Medicated and Controlled,  Endo/Other  negative endocrine ROS  Renal/GU negative Renal ROS     Musculoskeletal negative musculoskeletal ROS (+)   Abdominal   Peds  Hematology negative hematology ROS (+)   Anesthesia Other Findings Covid test negative   Reproductive/Obstetrics                            Anesthesia Physical Anesthesia Plan  ASA: 2  Anesthesia Plan: General   Post-op Pain Management:    Induction: Intravenous  PONV Risk Score and Plan: 3 and Treatment may vary due to age or medical condition, Ondansetron, Dexamethasone and Midazolam  Airway Management Planned: LMA  Additional Equipment: None  Intra-op Plan:   Post-operative Plan: Extubation in OR  Informed Consent: I have reviewed the patients History and Physical, chart, labs and discussed the procedure including the risks, benefits and alternatives for the proposed anesthesia with the patient or authorized representative who has indicated his/her understanding and acceptance.     Dental advisory given and Interpreter used for interveiw  Plan Discussed with: CRNA and Anesthesiologist  Anesthesia Plan Comments:        Anesthesia Quick Evaluation

## 2021-07-08 ENCOUNTER — Encounter (HOSPITAL_COMMUNITY): Payer: Self-pay | Admitting: Urology

## 2021-07-08 LAB — URINE CULTURE

## 2021-07-08 LAB — COMPREHENSIVE METABOLIC PANEL
ALT: 23 U/L (ref 0–44)
AST: 18 U/L (ref 15–41)
Albumin: 3.7 g/dL (ref 3.5–5.0)
Alkaline Phosphatase: 57 U/L (ref 38–126)
Anion gap: 10 (ref 5–15)
BUN: 10 mg/dL (ref 6–20)
CO2: 24 mmol/L (ref 22–32)
Calcium: 8.8 mg/dL — ABNORMAL LOW (ref 8.9–10.3)
Chloride: 106 mmol/L (ref 98–111)
Creatinine, Ser: 0.57 mg/dL (ref 0.44–1.00)
GFR, Estimated: >60 mL/min (ref >60)
Glucose, Bld: 120 mg/dL — ABNORMAL HIGH (ref 70–99)
Potassium: 3.7 mmol/L (ref 3.5–5.1)
Sodium: 140 mmol/L (ref 135–145)
Total Bilirubin: 0.7 mg/dL (ref 0.3–1.2)
Total Protein: 7 g/dL (ref 6.5–8.1)

## 2021-07-08 LAB — CBC
HCT: 37.4 % (ref 36.0–46.0)
Hemoglobin: 12 g/dL (ref 12.0–15.0)
MCH: 25.4 pg — ABNORMAL LOW (ref 26.0–34.0)
MCHC: 32.1 g/dL (ref 30.0–36.0)
MCV: 79.2 fL — ABNORMAL LOW (ref 80.0–100.0)
Platelets: 289 10*3/uL (ref 150–400)
RBC: 4.72 MIL/uL (ref 3.87–5.11)
RDW: 14.2 % (ref 11.5–15.5)
WBC: 8.1 10*3/uL (ref 4.0–10.5)
nRBC: 0 % (ref 0.0–0.2)

## 2021-07-08 LAB — HIV ANTIBODY (ROUTINE TESTING W REFLEX): HIV Screen 4th Generation wRfx: NONREACTIVE

## 2021-07-08 MED ORDER — SULFAMETHOXAZOLE-TRIMETHOPRIM 800-160 MG PO TABS: 1.0000 | ORAL_TABLET | Freq: Two times a day (BID) | ORAL | 0 refills | Status: AC

## 2021-07-08 NOTE — Discharge Summary (Signed)
Physician Discharge Summary  Lori Young RWE:[id number] DOB: 10-23-1986 DOA: 07/06/2021  PCP: Patient, No Pcp Per (Inactive)  Admit date: 07/06/2021 Discharge date: 07/08/2021  Admitted From: home Disposition:  home  Recommendations for Outpatient Follow-up:  Follow up with PCP and urology in 1-2 weeks  Home Health:no  Equipment/Devices: none  Discharge Condition: Stable Code Status:   Code Status: Full Code Diet recommendation:  Diet Order             Diet general           Diet NPO time specified  Diet effective now                    Brief/Interim Summary:   Brief Narrative: 35 year old female with GERD admitted with 2-week of right flank pain, seen by PCP recently. CT showed 74mm distal right ureteral stone w/ mild right hydronephroureter. She was started on rocephin and pain control. Urology was consulted. [facility] was called for admission 7/12-s/p cystoscopy with right ureteric stent Post op did well,seen by urology nextday and okay to go home on bactrim x 7 days and have urology follow up  Discharge Diagnoses:  UTI- mixed organism on culture. Obstructive uropathy Status post cystoscopy right pyelogram and right stent placement 7/12, Dr Alvester Morin.  Leukocytosis resolved.  Okayo to send home on bactrim per uro  Obesity with BMI 29: Will benefit w/ weight loss lifestyle  Consults: urology  Subjective: Aaox3, pain better, doing well, midl rt flank pain. Okay for home  Discharge Exam: Vitals:   07/08/21 0305 07/08/21 0638  BP: 119/70 118/64  Pulse: 89 (!) 57  Resp: 18 17  Temp: 98.3 F (36.8 C) 98 F (36.7 C)  SpO2: 98% 97%   General: Pt is alert, awake, not in acute distress Cardiovascular: RRR, S1/S2 +, no rubs, no gallops Respiratory: CTA bilaterally, no wheezing, no rhonchi Abdominal: Soft, NT, ND, bowel sounds + Extremities: no edema, no cyanosis  Discharge Instructions  Discharge Instructions     Diet general   Complete by: As directed     Discharge instructions   Complete by: As directed    Please see Urology Dr Alvester Morin w/ [facility] as instructed, call office.  Please call call MD or return to ER for similar or worsening recurring problem that brought you to hospital or if any fever,nausea/vomiting,abdominal pain, uncontrolled pain, chest pain,  shortness of breath or any other alarming symptoms.  Please follow-up your doctor as instructed in a week time and call the office for appointment.  Please avoid alcohol, smoking, or any other illicit substance and maintain healthy habits including taking your regular medications as prescribed.  You were cared for by a hospitalist during your hospital stay. If you have any questions about your discharge medications or the care you received while you were in the hospital after you are discharged, you can call the unit and ask to speak with the hospitalist on call if the hospitalist that took care of you is not available.  Once you are discharged, your primary care physician will handle any further medical issues. Please note that NO REFILLS for any discharge medications will be authorized once you are discharged, as it is imperative that you return to your primary care physician (or establish a relationship with a primary care physician if you do not have one) for your aftercare needs so that they can reassess your need for medications and monitor your lab values   Increase activity slowly  Complete by: As directed       Allergies as of 07/08/2021       Reactions   Lactose Intolerance (gi) Other (See Comments)   Upset stomach        Medication List     TAKE these medications    acetaminophen 500 MG tablet Commonly known as: TYLENOL Take 1,000 mg by mouth every 6 (six) hours as needed for headache.   ibuprofen 600 MG tablet Commonly known as: ADVIL Take 1 tablet (600 mg total) by mouth every 6 (six) hours as needed for mild pain or moderate pain.   omeprazole 20 MG  capsule Commonly known as: PRILOSEC Take 1 capsule (20 mg total) by mouth daily.   oxyCODONE-acetaminophen 5-325 MG tablet Commonly known as: PERCOCET/ROXICET Take 1 tablet by mouth every 6 (six) hours as needed for up to 15 doses for severe pain.   sulfamethoxazole-trimethoprim 800-160 MG tablet Commonly known as: BACTRIM DS Take 1 tablet by mouth 2 (two) times daily for 7 days.   tamsulosin 0.4 MG Caps capsule Commonly known as: FLOMAX Take 1 capsule (0.4 mg total) by mouth daily after breakfast for 15 days.        Follow-up Information     [facility]-EMERGENCY DEPT. Go to .   Specialty: Emergency Medicine Why: If symptoms worsen Contact information: [address] [id number] [address] [place] [address] [phone]        [facility]. Schedule an appointment as soon as possible for a visit in 1 day.   Contact information: [address] [place] [address] [phone]               Allergies  Allergen Reactions   Lactose Intolerance (Gi) Other (See Comments)    Upset stomach    The results of significant diagnostics from this hospitalization (including imaging, microbiology, ancillary and laboratory) are listed below for reference.    Microbiology: Recent Results (from the past 240 hour(s))  Urine culture     Status: Abnormal   Collection Time: 07/07/21  1:20 AM   Specimen: Urine, Random  Result Value Ref Range Status   Specimen Description   Final    URINE, RANDOM Performed at [facility], [address], [place], [place] [address]    Special Requests   Final    NONE Performed at [facility], [address], [place], [place] [address]    Culture MULTIPLE SPECIES PRESENT, SUGGEST RECOLLECTION (A)  Final   Report Status 07/08/2021 FINAL  Final  Resp Panel by RT-PCR (Flu A&B, Covid) Nasopharyngeal Swab     Status: None   Collection  Time: 07/07/21  9:56 AM   Specimen: Nasopharyngeal Swab; Nasopharyngeal(NP) swabs in vial transport medium  Result Value Ref Range Status   SARS Coronavirus 2 by RT PCR NEGATIVE NEGATIVE Final    Comment: (NOTE) SARS-CoV-2 target nucleic acids are NOT DETECTED.  The SARS-CoV-2 RNA is generally detectable in upper respiratory specimens during the acute phase of infection. The lowest concentration of SARS-CoV-2 viral copies this assay can detect is 138 copies/mL. A negative result does not preclude SARS-Cov-2 infection and should not be used as the sole basis for treatment or other patient management decisions. A negative result may occur with  improper specimen collection/handling, submission of specimen other than nasopharyngeal swab, presence of viral mutation(s) within the areas targeted by this assay, and inadequate number of viral copies(<138 copies/mL). A negative result  must be combined with clinical observations, patient history, and epidemiological information. The expected result is Negative.  Fact Sheet for Patients:  BloggerCourse.com  Fact Sheet for Healthcare Providers:  SeriousBroker.it  This test is no t yet approved or cleared by the [place] FDA and  has been authorized for detection and/or diagnosis of SARS-CoV-2 by FDA under an Emergency Use Authorization (EUA). This EUA will remain  in effect (meaning this test can be used) for the duration of the COVID-19 declaration under Section 564(b)(1) of the Act, 21 U.S.C.section 360bbb-3(b)(1), unless the authorization is terminated  or revoked sooner.       Influenza A by PCR NEGATIVE NEGATIVE Final   Influenza B by PCR NEGATIVE NEGATIVE Final    Comment: (NOTE) The Xpert Xpress SARS-CoV-2/FLU/RSV plus assay is intended as an aid in the diagnosis of influenza from Nasopharyngeal swab specimens and should not be used as a sole basis for treatment. Nasal washings  and aspirates are unacceptable for Xpert Xpress SARS-CoV-2/FLU/RSV testing.  Fact Sheet for Patients: BloggerCourse.com  Fact Sheet for Healthcare Providers: SeriousBroker.it  This test is not yet approved or cleared by the [place] FDA and has been authorized for detection and/or diagnosis of SARS-CoV-2 by FDA under an Emergency Use Authorization (EUA). This EUA will remain in effect (meaning this test can be used) for the duration of the COVID-19 declaration under Section 564(b)(1) of the Act, 21 U.S.C. section 360bbb-3(b)(1), unless the authorization is terminated or revoked.  Performed at [facility], [address], [place], [place] [address]     Procedures/Studies: DG C-Arm 1-60 Min-No Report  Result Date: 07/07/2021 Fluoroscopy was utilized by the requesting physician.  No radiographic interpretation.   CT Renal Stone Study  Result Date: 07/06/2021 CLINICAL DATA:  35 year old female with flank pain. Concern for kidney stone. EXAM: CT ABDOMEN AND PELVIS WITHOUT CONTRAST TECHNIQUE: Multidetector CT imaging of the abdomen and pelvis was performed following the standard protocol without IV contrast. COMPARISON:  None. FINDINGS: Evaluation of this exam is limited in the absence of intravenous contrast. Lower chest: Right lung base atelectasis. The visualized lung bases are otherwise clear. No intra-abdominal free air or free fluid. Hepatobiliary: The liver is unremarkable. No intrahepatic biliary ductal dilatation. Several noncalcified stones noted within the gallbladder. No pericholecystic fluid or evidence of acute cholecystitis by CT. Ultrasound may provide better evaluation of the gallbladder if clinically indicated. Pancreas: Unremarkable. No pancreatic ductal dilatation or surrounding inflammatory changes. Spleen: Normal in size without focal abnormality. Adrenals/Urinary Tract: The adrenal glands are  unremarkable. There is a 9 mm stone in the distal right ureter with mild right hydronephroureter. The left kidney, left ureter, and urinary bladder appear unremarkable. Stomach/Bowel: There is no bowel obstruction or active inflammation. The appendix is normal. Vascular/Lymphatic: The abdominal aorta and IVC are grossly unremarkable on this noncontrast CT. No portal venous gas. There is no adenopathy. Reproductive: The uterus is anteverted. No adnexal masses. Other: None Musculoskeletal: No acute or significant osseous findings. IMPRESSION: 1. A 9 mm distal right ureteral stone with mild right hydronephroureter. 2. Cholelithiasis. Electronically Signed   By: Elgie Collard M.D.   On: 07/06/2021 23:41    Labs: BNP (last 3 results) No results for input(s): BNP in the last 8760 hours. Basic Metabolic Panel: Recent Labs  Lab 07/06/21 2218 07/08/21 0435  NA 140 140  K 3.6 3.7  CL 105 106  CO2 21* 24  GLUCOSE 135* 120*  BUN 15 10  CREATININE 0.72  0.57  CALCIUM 9.5 8.8*   Liver Function Tests: Recent Labs  Lab 07/08/21 0435  AST 18  ALT 23  ALKPHOS 57  BILITOT 0.7  PROT 7.0  ALBUMIN 3.7   No results for input(s): LIPASE, AMYLASE in the last 168 hours. No results for input(s): AMMONIA in the last 168 hours. CBC: Recent Labs  Lab 07/07/21 0106 07/08/21 0435  WBC 18.1* 8.1  NEUTROABS 15.5*  --   HGB 12.5 12.0  HCT 39.2 37.4  MCV 79.7* 79.2*  PLT 315 289   Cardiac Enzymes: No results for input(s): CKTOTAL, CKMB, CKMBINDEX, TROPONINI in the last 168 hours. BNP: Invalid input(s): POCBNP CBG: No results for input(s): GLUCAP in the last 168 hours. D-Dimer No results for input(s): DDIMER in the last 72 hours. Hgb A1c Recent Labs    07/07/21 2030  HGBA1C 6.1*   Lipid Profile No results for input(s): CHOL, HDL, LDLCALC, TRIG, CHOLHDL, LDLDIRECT in the last 72 hours. Thyroid function studies No results for input(s): TSH, T4TOTAL, T3FREE, THYROIDAB in the last 72  hours.  Invalid input(s): FREET3 Anemia work up No results for input(s): VITAMINB12, FOLATE, FERRITIN, TIBC, IRON, RETICCTPCT in the last 72 hours. Urinalysis    Component Value Date/Time   COLORURINE YELLOW 07/07/2021 0120   APPEARANCEUR TURBID (A) 07/07/2021 0120   LABSPEC 1.019 07/07/2021 0120   PHURINE 9.0 (H) 07/07/2021 0120   GLUCOSEU NEGATIVE 07/07/2021 0120   HGBUR SMALL (A) 07/07/2021 0120   BILIRUBINUR NEGATIVE 07/07/2021 0120   KETONESUR 20 (A) 07/07/2021 0120   PROTEINUR 30 (A) 07/07/2021 0120   NITRITE NEGATIVE 07/07/2021 0120   LEUKOCYTESUR MODERATE (A) 07/07/2021 0120   Sepsis Labs Invalid input(s): PROCALCITONIN,  WBC,  LACTICIDVEN Microbiology Recent Results (from the past 240 hour(s))  Urine culture     Status: Abnormal   Collection Time: 07/07/21  1:20 AM   Specimen: Urine, Random  Result Value Ref Range Status   Specimen Description   Final    URINE, RANDOM Performed at [facility], [address] [place], [place] [address]    Special Requests   Final    NONE Performed at [facility], [address], [place], [place] [address]    Culture MULTIPLE SPECIES PRESENT, SUGGEST RECOLLECTION (A)  Final   Report Status 07/08/2021 FINAL  Final  Resp Panel by RT-PCR (Flu A&B, Covid) Nasopharyngeal Swab     Status: None   Collection Time: 07/07/21  9:56 AM   Specimen: Nasopharyngeal Swab; Nasopharyngeal(NP) swabs in vial transport medium  Result Value Ref Range Status   SARS Coronavirus 2 by RT PCR NEGATIVE NEGATIVE Final    Comment: (NOTE) SARS-CoV-2 target nucleic acids are NOT DETECTED.  The SARS-CoV-2 RNA is generally detectable in upper respiratory specimens during the acute phase of infection. The lowest concentration of SARS-CoV-2 viral copies this assay can detect is 138 copies/mL. A negative result does not preclude SARS-Cov-2 infection and should not be used as the sole basis for treatment or other patient  management decisions. A negative result may occur with  improper specimen collection/handling, submission of specimen other than nasopharyngeal swab, presence of viral mutation(s) within the areas targeted by this assay, and inadequate number of viral copies(<138 copies/mL). A negative result must be combined with clinical observations, patient history, and epidemiological information. The expected result is Negative.  Fact Sheet for Patients:  BloggerCourse.com  Fact Sheet for Healthcare Providers:  SeriousBroker.it  This test is no t yet approved or cleared  by the [place] FDA and  has been authorized for detection and/or diagnosis of SARS-CoV-2 by FDA under an Emergency Use Authorization (EUA). This EUA will remain  in effect (meaning this test can be used) for the duration of the COVID-19 declaration under Section 564(b)(1) of the Act, 21 U.S.C.section 360bbb-3(b)(1), unless the authorization is terminated  or revoked sooner.       Influenza A by PCR NEGATIVE NEGATIVE Final   Influenza B by PCR NEGATIVE NEGATIVE Final    Comment: (NOTE) The Xpert Xpress SARS-CoV-2/FLU/RSV plus assay is intended as an aid in the diagnosis of influenza from Nasopharyngeal swab specimens and should not be used as a sole basis for treatment. Nasal washings and aspirates are unacceptable for Xpert Xpress SARS-CoV-2/FLU/RSV testing.  Fact Sheet for Patients: BloggerCourse.comhttps://www.fda.gov/media/152166/download  Fact Sheet for Healthcare Providers: SeriousBroker.ithttps://www.fda.gov/media/152162/download  This test is not yet approved or cleared by the [place] FDA and has been authorized for detection and/or diagnosis of SARS-CoV-2 by FDA under an Emergency Use Authorization (EUA). This EUA will remain in effect (meaning this test can be used) for the duration of the COVID-19 declaration under Section 564(b)(1) of the Act, 21 U.S.C. section 360bbb-3(b)(1),  unless the authorization is terminated or revoked.  Performed at [facility], [address] [place], [place] [id number]      Time coordinating discharge: 25 minutes  SIGNED: Lanae Boastamesh Kieffer Blatz, MD  [facility] 07/08/2021, 8:56 AM  If 7PM-7AM, please contact night-coverage [email or web address]

## 2021-07-08 NOTE — Progress Notes (Signed)
Utilized [employer] Video Interpreting to explain, discuss and review discharge instructions and medications.

## 2021-07-08 NOTE — Progress Notes (Signed)
Urology Inpatient Progress Report  Kidney stone [N20.0] Nephrolithiasis [N20.0]  Procedure(s): CYSTOSCOPY WITH STENT PLACEMENT  1 Day Post-Op   Intv/Subj: No acute events overnight. Patient is without complaint.  Denies any current pain or discomfort.  Has been afebrile.  Leukocytosis resolved.  Active Problems:   Nephrolithiasis  Current Facility-Administered Medications  Medication Dose Route Frequency Provider Last Rate Last Admin   cefTRIAXone (ROCEPHIN) 1 g in sodium chloride 0.9 % 100 mL IVPB  1 g Intravenous Q24H Kyle, Tyrone A, DO       lactated ringers infusion   Intravenous Continuous Kyle, Tyrone A, DO 100 mL/hr at 07/07/21 1925 New Bag at 07/07/21 1925   morphine 2 MG/ML injection 2 mg  2 mg Intravenous Q2H PRN Ronaldo Miyamoto, Tyrone A, DO   2 mg at 07/07/21 2200   pantoprazole (PROTONIX) injection 40 mg  40 mg Intravenous Q24H Kyle, Tyrone A, DO   40 mg at 07/07/21 2159   prochlorperazine (COMPAZINE) injection 10 mg  10 mg Intravenous Q6H PRN Ronaldo Miyamoto, Tyrone A, DO   10 mg at 07/07/21 1921     Objective: Vital: Vitals:   07/07/21 1919 07/07/21 2235 07/08/21 0305 07/08/21 0638  BP: 109/67 (!) 141/74 119/70 118/64  Pulse: 60 84 89 (!) 57  Resp: 18 18 18 17   Temp: 97.6 F (36.4 C) 98.7 F (37.1 C) 98.3 F (36.8 C) 98 F (36.7 C)  TempSrc: Oral Oral Oral Oral  SpO2: 100% 93% 98% 97%  Weight:      Height:       I/Os: I/O last 3 completed shifts: In: 1758.3 [I.V.:1558.3; IV Piggyback:200] Out: 1100 [Urine:1100]  Physical Exam:  General: Patient is in no apparent distress Lungs: Normal respiratory effort, chest expands symmetrically. GI: The abdomen is soft and nontender without mass. Ext: lower extremities symmetric  Lab Results: Recent Labs    07/07/21 0106 07/08/21 0435  WBC 18.1* 8.1  HGB 12.5 12.0  HCT 39.2 37.4   Recent Labs    07/06/21 2218 07/08/21 0435  NA 140 140  K 3.6 3.7  CL 105 106  CO2 21* 24  GLUCOSE 135* 120*  BUN 15 10  CREATININE  0.72 0.57  CALCIUM 9.5 8.8*   No results for input(s): LABPT, INR in the last 72 hours. No results for input(s): LABURIN in the last 72 hours. Results for orders placed or performed during the hospital encounter of 07/06/21  Resp Panel by RT-PCR (Flu A&B, Covid) Nasopharyngeal Swab     Status: None   Collection Time: 07/07/21  9:56 AM   Specimen: Nasopharyngeal Swab; Nasopharyngeal(NP) swabs in vial transport medium  Result Value Ref Range Status   SARS Coronavirus 2 by RT PCR NEGATIVE NEGATIVE Final    Comment: (NOTE) SARS-CoV-2 target nucleic acids are NOT DETECTED.  The SARS-CoV-2 RNA is generally detectable in upper respiratory specimens during the acute phase of infection. The lowest concentration of SARS-CoV-2 viral copies this assay can detect is 138 copies/mL. A negative result does not preclude SARS-Cov-2 infection and should not be used as the sole basis for treatment or other patient management decisions. A negative result may occur with  improper specimen collection/handling, submission of specimen other than nasopharyngeal swab, presence of viral mutation(s) within the areas targeted by this assay, and inadequate number of viral copies(<138 copies/mL). A negative result must be combined with clinical observations, patient history, and epidemiological information. The expected result is Negative.  Fact Sheet for Patients:  09/07/21  Fact Sheet for  Healthcare Providers:  SeriousBroker.it  This test is no t yet approved or cleared by the [place] and  has been authorized for detection and/or diagnosis of SARS-CoV-2 by FDA under an Emergency Use Authorization (EUA). This EUA will remain  in effect (meaning this test can be used) for the duration of the COVID-19 declaration under Section 564(b)(1) of the Act, 21 U.S.C.section 360bbb-3(b)(1), unless the authorization is terminated  or revoked sooner.        Influenza A by PCR NEGATIVE NEGATIVE Final   Influenza B by PCR NEGATIVE NEGATIVE Final    Comment: (NOTE) The Xpert Xpress SARS-CoV-2/FLU/RSV plus assay is intended as an aid in the diagnosis of influenza from Nasopharyngeal swab specimens and should not be used as a sole basis for treatment. Nasal washings and aspirates are unacceptable for Xpert Xpress SARS-CoV-2/FLU/RSV testing.  Fact Sheet for Patients: BloggerCourse.com  Fact Sheet for Healthcare Providers: SeriousBroker.it  This test is not yet approved or cleared by the [place] FDA and has been authorized for detection and/or diagnosis of SARS-CoV-2 by FDA under an Emergency Use Authorization (EUA). This EUA will remain in effect (meaning this test can be used) for the duration of the COVID-19 declaration under Section 564(b)(1) of the Act, 21 U.S.C. section 360bbb-3(b)(1), unless the authorization is terminated or revoked.  Performed at [facility], [address], [place], [place] [address]     Studies/Results: DG C-Arm 1-60 Min-No Report  Result Date: 07/07/2021 Fluoroscopy was utilized by the requesting physician.  No radiographic interpretation.   CT Renal Stone Study  Result Date: 07/06/2021 CLINICAL DATA:  35 year old female with flank pain. Concern for kidney stone. EXAM: CT ABDOMEN AND PELVIS WITHOUT CONTRAST TECHNIQUE: Multidetector CT imaging of the abdomen and pelvis was performed following the standard protocol without IV contrast. COMPARISON:  None. FINDINGS: Evaluation of this exam is limited in the absence of intravenous contrast. Lower chest: Right lung base atelectasis. The visualized lung bases are otherwise clear. No intra-abdominal free air or free fluid. Hepatobiliary: The liver is unremarkable. No intrahepatic biliary ductal dilatation. Several noncalcified stones noted within the gallbladder. No pericholecystic fluid  or evidence of acute cholecystitis by CT. Ultrasound may provide better evaluation of the gallbladder if clinically indicated. Pancreas: Unremarkable. No pancreatic ductal dilatation or surrounding inflammatory changes. Spleen: Normal in size without focal abnormality. Adrenals/Urinary Tract: The adrenal glands are unremarkable. There is a 9 mm stone in the distal right ureter with mild right hydronephroureter. The left kidney, left ureter, and urinary bladder appear unremarkable. Stomach/Bowel: There is no bowel obstruction or active inflammation. The appendix is normal. Vascular/Lymphatic: The abdominal aorta and IVC are grossly unremarkable on this noncontrast CT. No portal venous gas. There is no adenopathy. Reproductive: The uterus is anteverted. No adnexal masses. Other: None Musculoskeletal: No acute or significant osseous findings. IMPRESSION: 1. A 9 mm distal right ureteral stone with mild right hydronephroureter. 2. Cholelithiasis. Electronically Signed   By: Elgie Collard M.D.   On: 07/06/2021 23:41    Assessment: Right ureteral calculus UTI  Procedure(s): CYSTOSCOPY WITH STENT PLACEMENT, 1 Day Post-Op  doing well.  Plan: Patient is stable and leukocytosis has improved.  Reasonable to discharge with 7 days of antibiotic such as Bactrim DS twice daily.  I will get her scheduled outpatient to see me.   Modena Slater, MD Urology 07/08/2021, 8:24 AM

## 2021-07-08 NOTE — Plan of Care (Signed)
  Problem: Coping: Goal: Level of anxiety will decrease Outcome: Progressing   Problem: Elimination: Goal: Will not experience complications related to urinary retention Outcome: Progressing   Problem: Pain Managment: Goal: General experience of comfort will improve Outcome: Progressing   Problem: Urinary Elimination: Goal: Signs and symptoms of infection will decrease Outcome: Progressing

## 2021-07-17 ENCOUNTER — Other Ambulatory Visit: Payer: Self-pay | Admitting: Urology

## 2021-07-21 NOTE — Patient Instructions (Signed)
DUE TO COVID-19 ONLY ONE VISITOR IS ALLOWED TO COME WITH YOU AND STAY IN THE WAITING ROOM ONLY DURING PRE OP AND PROCEDURE DAY OF SURGERY. THE 1 VISITOR  MAY VISIT WITH YOU AFTER SURGERY IN YOUR PRIVATE ROOM DURING VISITING HOURS ONLY!               Lori Young   Your procedure is scheduled on: 07/24/21   Report to [facility] Main  Entrance   Report to admitting at: 12:00 PM     Call this number if you have problems the morning of surgery [phone]    Remember: Do not eat slid food :After Midnight. Clear liquids until: 11:00 AM.  CLEAR LIQUID DIET  Foods Allowed                                                                     Foods Excluded  Coffee and tea, regular and decaf                             liquids that you cannot  Plain Jell-O any favor except red or purple                                           see through such as: Fruit ices (not with fruit pulp)                                     milk, soups, orange juice  Iced Popsicles                                    All solid food Carbonated beverages, regular and diet                                    Cranberry, grape and apple juices Sports drinks like Gatorade Lightly seasoned clear broth or consume(fat free) Sugar, honey syrup  Sample Menu Breakfast                                Lunch                                     Supper Cranberry juice                    Beef broth                            Chicken broth Jell-O                                     Grape juice  Apple juice Coffee or tea                        Jell-O                                      Popsicle                                                Coffee or tea                        Coffee or tea  _____________________________________________________________________   BRUSH YOUR TEETH MORNING OF SURGERY AND RINSE YOUR MOUTH OUT, NO CHEWING GUM CANDY OR MINTS.    Take these medicines the morning of  surgery with A SIP OF WATER: omeprazole.                               You may not have any metal on your body including hair pins and              piercings  Do not wear jewelry, make-up, lotions, powders or perfumes, deodorant             Do not wear nail polish on your fingernails.  Do not shave  48 hours prior to surgery.    Do not bring valuables to the hospital. [facility] IS NOT             RESPONSIBLE   FOR VALUABLES.  Contacts, dentures or bridgework may not be worn into surgery.  Leave suitcase in the car. After surgery it may be brought to your room.     Patients discharged the day of surgery will not be allowed to drive home. IF YOU ARE HAVING SURGERY AND GOING HOME THE SAME DAY, YOU MUST HAVE AN ADULT TO DRIVE YOU HOME AND BE WITH YOU FOR 24 HOURS. YOU MAY GO HOME BY TAXI OR UBER OR ORTHERWISE, BUT AN ADULT MUST ACCOMPANY YOU HOME AND STAY WITH YOU FOR 24 HOURS.  Name and phone number of your driver:  Special Instructions: N/A              Please read over the following fact sheets you were given: _____________________________________________________________________           [facility] - Preparing for Surgery Before surgery, you can play an important role.  Because skin is not sterile, your skin needs to be as free of germs as possible.  You can reduce the number of germs on your skin by washing with CHG (chlorahexidine gluconate) soap before surgery.  CHG is an antiseptic cleaner which kills germs and bonds with the skin to continue killing germs even after washing. Please DO NOT use if you have an allergy to CHG or antibacterial soaps.  If your skin becomes reddened/irritated stop using the CHG and inform your nurse when you arrive at Short Stay. Do not shave (including legs and underarms) for at least 48 hours prior to the first CHG shower.  You may shave your face/neck. Please follow these instructions carefully:  1.  Shower with CHG Soap the night before surgery and the  morning of Surgery.  2.  If you choose to wash your hair, wash your hair first as usual with your  normal  shampoo.  3.  After you shampoo, rinse your hair and body thoroughly to remove the  shampoo.                           4.  Use CHG as you would any other liquid soap.  You can apply chg directly  to the skin and wash                       Gently with a scrungie or clean washcloth.  5.  Apply the CHG Soap to your body ONLY FROM THE NECK DOWN.   Do not use on face/ open                           Wound or open sores. Avoid contact with eyes, ears mouth and genitals (private parts).                       Wash face,  Genitals (private parts) with your normal soap.             6.  Wash thoroughly, paying special attention to the area where your surgery  will be performed.  7.  Thoroughly rinse your body with warm water from the neck down.  8.  DO NOT shower/wash with your normal soap after using and rinsing off  the CHG Soap.                9.  Pat yourself dry with a clean towel.            10.  Wear clean pajamas.            11.  Place clean sheets on your bed the night of your first shower and do not  sleep with pets. Day of Surgery : Do not apply any lotions/deodorants the morning of surgery.  Please wear clean clothes to the hospital/surgery center.  FAILURE TO FOLLOW THESE INSTRUCTIONS MAY RESULT IN THE CANCELLATION OF YOUR SURGERY PATIENT SIGNATURE_________________________________  NURSE SIGNATURE__________________________________  ________________________________________________________________________

## 2021-07-22 ENCOUNTER — Other Ambulatory Visit: Payer: Self-pay

## 2021-07-22 ENCOUNTER — Encounter (HOSPITAL_COMMUNITY)
Admission: RE | Admit: 2021-07-22 | Discharge: 2021-07-22 | Disposition: A | Discharge status: Home / Self Care | Payer: Self-pay | Source: Ambulatory Visit | Attending: Urology | Admitting: Urology

## 2021-07-22 ENCOUNTER — Encounter (HOSPITAL_COMMUNITY): Payer: Self-pay

## 2021-07-22 DIAGNOSIS — Z01812 Encounter for preprocedural laboratory examination: Secondary | ICD-10-CM | POA: Insufficient documentation

## 2021-07-22 HISTORY — DX: Depression, unspecified: F32.A

## 2021-07-22 HISTORY — DX: Nausea with vomiting, unspecified: R11.2

## 2021-07-22 HISTORY — DX: Personal history of urinary calculi: Z87.442

## 2021-07-22 HISTORY — DX: Other specified postprocedural states: Z98.890

## 2021-07-22 HISTORY — DX: Anxiety disorder, unspecified: F41.9

## 2021-07-22 HISTORY — DX: Other complications of anesthesia, initial encounter: T88.59XA

## 2021-07-22 HISTORY — DX: Angina pectoris, unspecified: I20.9

## 2021-07-22 HISTORY — DX: Tachycardia, unspecified: R00.0

## 2021-07-22 NOTE — Progress Notes (Signed)
COVID Vaccine Completed: NO Date COVID Vaccine completed: COVID vaccine manufacturer: Pfizer    Moderna   [employer]   PCP - NO PCP Cardiologist -   Chest x-ray -  EKG - 10/03/20 Stress Test -  ECHO -  Cardiac Cath -  Pacemaker/ICD device last checked:  Sleep Study -  CPAP -   Fasting Blood Sugar -  Checks Blood Sugar _____ times a day  Blood Thinner Instructions: Aspirin Instructions: Last Dose:  Anesthesia review:   Patient denies shortness of breath, fever, cough and chest pain at PAT appointment   Patient verbalized understanding of instructions that were given to them at the PAT appointment. Patient was also instructed that they will need to review over the PAT instructions again at home before surgery.

## 2021-07-23 ENCOUNTER — Encounter (HOSPITAL_COMMUNITY): Payer: Self-pay

## 2021-07-23 NOTE — Anesthesia Preprocedure Evaluation (Addendum)
Anesthesia Evaluation  Patient identified by MRN, date of birth, ID band Patient awake    Reviewed: Allergy & Precautions, NPO status , Patient's Chart, lab work & pertinent test results  Airway Mallampati: II  TM Distance: >3 FB Neck ROM: Full    Dental no notable dental hx. (+) Teeth Intact, Dental Advisory Given   Pulmonary neg pulmonary ROS,    Pulmonary exam normal breath sounds clear to auscultation       Cardiovascular Normal cardiovascular exam Rhythm:Regular Rate:Normal     Neuro/Psych  Headaches,    GI/Hepatic GERD  ,  Endo/Other  negative endocrine ROS  Renal/GU Renal disease     Musculoskeletal negative musculoskeletal ROS (+)   Abdominal   Peds  Hematology Lab Results      Component                Value               Date                      WBC                      8.1                 07/08/2021                HGB                      12.0                07/08/2021                HCT                      37.4                07/08/2021                MCV                      79.2 (L)            07/08/2021                PLT                      289                 07/08/2021              Anesthesia Other Findings   Reproductive/Obstetrics                            Anesthesia Physical Anesthesia Plan  ASA: 1  Anesthesia Plan: General   Post-op Pain Management:    Induction: Intravenous  PONV Risk Score and Plan: 3 and 4 or greater and Treatment may vary due to age or medical condition, Midazolam, Dexamethasone and Ondansetron  Airway Management Planned: LMA  Additional Equipment: None  Intra-op Plan:   Post-operative Plan:   Informed Consent: I have reviewed the patients History and Physical, chart, labs and discussed the procedure including the risks, benefits and alternatives for the proposed anesthesia with the patient or authorized representative who has  indicated his/her understanding and acceptance.     Interpreter used for [employer]  and Dental advisory given  Plan Discussed with: CRNA and Anesthesiologist  Anesthesia Plan Comments: (LMA)       Anesthesia Quick Evaluation

## 2021-07-24 ENCOUNTER — Ambulatory Visit (HOSPITAL_COMMUNITY)
Admission: RE | Admit: 2021-07-24 | Discharge: 2021-07-24 | Disposition: A | Discharge status: Home / Self Care | Payer: Self-pay | Attending: Urology | Admitting: Urology

## 2021-07-24 ENCOUNTER — Ambulatory Visit (HOSPITAL_COMMUNITY): Payer: Self-pay

## 2021-07-24 ENCOUNTER — Encounter (HOSPITAL_COMMUNITY): Payer: Self-pay | Admitting: Urology

## 2021-07-24 ENCOUNTER — Ambulatory Visit (HOSPITAL_COMMUNITY): Payer: Self-pay | Admitting: Anesthesiology

## 2021-07-24 ENCOUNTER — Encounter (HOSPITAL_COMMUNITY)
Admission: RE | Disposition: A | Discharge status: Home / Self Care | Payer: Self-pay | Source: Home / Self Care | Attending: Urology

## 2021-07-24 DIAGNOSIS — N201 Calculus of ureter: Secondary | ICD-10-CM | POA: Insufficient documentation

## 2021-07-24 DIAGNOSIS — K219 Gastro-esophageal reflux disease without esophagitis: Secondary | ICD-10-CM | POA: Insufficient documentation

## 2021-07-24 HISTORY — PX: CYSTOSCOPY/URETEROSCOPY/HOLMIUM LASER/STENT PLACEMENT: SHX6546

## 2021-07-24 LAB — PREGNANCY, URINE: Preg Test, Ur: NEGATIVE

## 2021-07-24 SURGERY — CYSTOSCOPY/URETEROSCOPY/HOLMIUM LASER/STENT PLACEMENT
Anesthesia: General | Site: Ureter | Laterality: Right

## 2021-07-24 MED ORDER — ONDANSETRON HCL 4 MG/2ML IJ SOLN
INTRAMUSCULAR | Status: AC
  Filled 2021-07-24: qty 2

## 2021-07-24 MED ORDER — FENTANYL CITRATE (PF) 100 MCG/2ML IJ SOLN
INTRAMUSCULAR | Status: AC
  Filled 2021-07-24: qty 2

## 2021-07-24 MED ORDER — CHLORHEXIDINE GLUCONATE 0.12 % MT SOLN
15.0000 mL | Freq: Once | OROMUCOSAL | Status: AC
  Administered 2021-07-24: 15 mL via OROMUCOSAL

## 2021-07-24 MED ORDER — SODIUM CHLORIDE 0.9 % IV SOLN
2.0000 g | INTRAVENOUS | Status: AC
  Administered 2021-07-24: 2 g via INTRAVENOUS
  Filled 2021-07-24: qty 2

## 2021-07-24 MED ORDER — DEXAMETHASONE SODIUM PHOSPHATE 4 MG/ML IJ SOLN
INTRAMUSCULAR | Status: DC | PRN
  Administered 2021-07-24: 4 mg via INTRAVENOUS

## 2021-07-24 MED ORDER — SODIUM CHLORIDE 0.9 % IR SOLN
Status: DC | PRN
  Administered 2021-07-24: 3000 mL via INTRAVESICAL

## 2021-07-24 MED ORDER — DEXAMETHASONE SODIUM PHOSPHATE 10 MG/ML IJ SOLN
INTRAMUSCULAR | Status: AC
  Filled 2021-07-24: qty 1

## 2021-07-24 MED ORDER — FENTANYL CITRATE (PF) 100 MCG/2ML IJ SOLN
INTRAMUSCULAR | Status: DC | PRN
  Administered 2021-07-24 (×3): 25 ug via INTRAVENOUS

## 2021-07-24 MED ORDER — ORAL CARE MOUTH RINSE: 15.0000 mL | Freq: Once | OROMUCOSAL | Status: AC

## 2021-07-24 MED ORDER — HYDROMORPHONE HCL 1 MG/ML IJ SOLN: 0.2500 mg | INTRAMUSCULAR | Status: DC | PRN

## 2021-07-24 MED ORDER — LIDOCAINE 2% (20 MG/ML) 5 ML SYRINGE
INTRAMUSCULAR | Status: DC | PRN
  Administered 2021-07-24: 100 mg via INTRAVENOUS

## 2021-07-24 MED ORDER — ONDANSETRON HCL 4 MG/2ML IJ SOLN
INTRAMUSCULAR | Status: DC | PRN
  Administered 2021-07-24: 4 mg via INTRAVENOUS

## 2021-07-24 MED ORDER — AMISULPRIDE (ANTIEMETIC) 5 MG/2ML IV SOLN: 10.0000 mg | Freq: Once | INTRAVENOUS | Status: DC | PRN

## 2021-07-24 MED ORDER — MIDAZOLAM HCL 5 MG/5ML IJ SOLN
INTRAMUSCULAR | Status: DC | PRN
  Administered 2021-07-24: 2 mg via INTRAVENOUS

## 2021-07-24 MED ORDER — EPHEDRINE 5 MG/ML INJ
INTRAVENOUS | Status: AC
  Filled 2021-07-24: qty 5

## 2021-07-24 MED ORDER — MIDAZOLAM HCL 2 MG/2ML IJ SOLN
INTRAMUSCULAR | Status: AC
  Filled 2021-07-24: qty 2

## 2021-07-24 MED ORDER — PROPOFOL 10 MG/ML IV BOLUS
INTRAVENOUS | Status: DC | PRN
Start: 2021-07-24 — End: 2021-07-24
  Administered 2021-07-24: 150 mg via INTRAVENOUS

## 2021-07-24 MED ORDER — EPHEDRINE SULFATE-NACL 50-0.9 MG/10ML-% IV SOSY
PREFILLED_SYRINGE | INTRAVENOUS | Status: DC | PRN
  Administered 2021-07-24: 10 mg via INTRAVENOUS

## 2021-07-24 MED ORDER — IOHEXOL 300 MG/ML  SOLN
INTRAMUSCULAR | Status: DC | PRN
  Administered 2021-07-24: 4 mL

## 2021-07-24 MED ORDER — OXYCODONE HCL 5 MG PO TABS
5.0000 mg | ORAL_TABLET | Freq: Once | ORAL | Status: DC | PRN
Start: 2021-07-24 — End: 2021-07-24

## 2021-07-24 MED ORDER — OXYCODONE HCL 5 MG/5ML PO SOLN: 5.0000 mg | Freq: Once | ORAL | Status: DC | PRN

## 2021-07-24 MED ORDER — ONDANSETRON HCL 4 MG/2ML IJ SOLN: 4.0000 mg | Freq: Once | INTRAMUSCULAR | Status: DC | PRN

## 2021-07-24 MED ORDER — ACETAMINOPHEN 10 MG/ML IV SOLN: 1000.0000 mg | Freq: Once | INTRAVENOUS | Status: DC | PRN

## 2021-07-24 MED ORDER — LACTATED RINGERS IV SOLN: INTRAVENOUS | Status: DC

## 2021-07-24 SURGICAL SUPPLY — 21 items
BAG URO CATCHER STRL LF (MISCELLANEOUS) ×2 IMPLANT
BASKET LASER NITINOL 1.9FR (BASKET) IMPLANT
BASKET ZERO TIP NITINOL 2.4FR (BASKET) IMPLANT
CATH INTERMIT  6FR 70CM (CATHETERS) ×2 IMPLANT
CLOTH BEACON ORANGE TIMEOUT ST (SAFETY) ×2 IMPLANT
EXTRACTOR STONE 1.7FRX115CM (UROLOGICAL SUPPLIES) IMPLANT
GLOVE SURG ENC MOIS LTX SZ7.5 (GLOVE) ×2 IMPLANT
GOWN STRL REUS W/TWL XL LVL3 (GOWN DISPOSABLE) ×2 IMPLANT
GUIDEWIRE ANG ZIPWIRE 038X150 (WIRE) IMPLANT
GUIDEWIRE STR DUAL SENSOR (WIRE) ×2 IMPLANT
KIT TURNOVER KIT A (KITS) ×2 IMPLANT
LASER FIB FLEXIVA PULSE ID 365 (Laser) IMPLANT
MANIFOLD NEPTUNE II (INSTRUMENTS) ×2 IMPLANT
PACK CYSTO (CUSTOM PROCEDURE TRAY) ×2 IMPLANT
SHEATH URETERAL 12FRX28CM (UROLOGICAL SUPPLIES) IMPLANT
SHEATH URETERAL 12FRX35CM (MISCELLANEOUS) IMPLANT
STENT URET 6FRX24 CONTOUR (STENTS) ×2 IMPLANT
TRACTIP FLEXIVA PULS ID 200XHI (Laser) IMPLANT
TRACTIP FLEXIVA PULSE ID 200 (Laser) ×2 IMPLANT
TUBING CONNECTING 10 (TUBING) ×2 IMPLANT
TUBING UROLOGY SET (TUBING) ×2 IMPLANT

## 2021-07-24 NOTE — Transfer of Care (Signed)
Immediate Anesthesia Transfer of Care Note  Patient: Vercie Pokorny  Procedure(s) Performed: CYSTOSCOPY RIGHT URETEROSCOPY/HOLMIUM LASER/STENT PLACEMENT (Right: Ureter)  Patient Location: PACU  Anesthesia Type:General  Level of Consciousness: awake  Airway & Oxygen Therapy: Patient Spontanous Breathing and Patient connected to face mask oxygen  Post-op Assessment: Report given to RN, Post -op Vital signs reviewed and stable and Patient moving all extremities X 4  Post vital signs: Reviewed and stable  Last Vitals:  Vitals Value Taken Time  BP 105/63 07/24/21 1535  Temp    Pulse 75 07/24/21 1538  Resp 14 07/24/21 1538  SpO2 100 % 07/24/21 1538  Vitals shown include unvalidated device data.  Last Pain:  Vitals:   07/24/21 1245  TempSrc:   PainSc: 0-No pain         Complications: No notable events documented.

## 2021-07-24 NOTE — Anesthesia Procedure Notes (Signed)
Procedure Name: LMA Insertion Date/Time: 07/24/2021 2:45 PM Performed by: Nelle Don, CRNA Pre-anesthesia Checklist: Patient identified, Emergency Drugs available, Suction available and Patient being monitored Patient Re-evaluated:Patient Re-evaluated prior to induction Oxygen Delivery Method: Circle system utilized Preoxygenation: Pre-oxygenation with 100% oxygen Induction Type: IV induction LMA: LMA with gastric port inserted LMA Size: 3.0 Number of attempts: 1 Dental Injury: Teeth and Oropharynx as per pre-operative assessment

## 2021-07-24 NOTE — Discharge Instructions (Addendum)
[facility] [phone] Post Ureteroscopy With or Without Stent Instructions  Remove your stent on Wednesday morning by gently pulling the string.  Definitions:  Ureter: The duct that transports urine from the kidney to the bladder. Stent:   A plastic hollow tube that is placed into the ureter, from the kidney to the                 bladder to prevent the ureter from swelling shut.  GENERAL INSTRUCTIONS:  Despite the fact that no skin incisions were used, the area around the ureter and bladder is raw and irritated. The stent is a foreign body which will further irritate the bladder wall. This irritation is manifested by increased frequency of urination, both day and night, and by an increase in the urge to urinate. In some, the urge to urinate is present almost always. Sometimes the urge is strong enough that you may not be able to stop yourself from urinating. The only real cure is to remove the stent and then give time for the bladder wall to heal which can't be done until the danger of the ureter swelling shut has passed, which varies.  You may see some blood in your urine while the stent is in place and a few days afterwards. Do not be alarmed, even if the urine was clear for a while. Get off your feet and drink lots of fluids until clearing occurs. If you start to pass clots or don't improve, call us.  DIET: You may return to your normal diet immediately. Because of the raw surface of your bladder, alcohol, spicy foods, acid type foods and drinks with caffeine may cause irritation or frequency and should be used in moderation. To keep your urine flowing freely and to avoid constipation, drink plenty of fluids during the day ( 8-10 glasses ). Tip: Avoid cranberry juice because it is very acidic.  ACTIVITY: Your physical activity doesn't need to be restricted. However, if you are very active, you may see some blood in your urine. We suggest that you reduce your activity  under these circumstances until the bleeding has stopped.  BOWELS: It is important to keep your bowels regular during the postoperative period. Straining with bowel movements can cause bleeding. A bowel movement every other day is reasonable. Use a mild laxative if needed, such as Milk of Magnesia 2-3 tablespoons, or 2 Dulcolax tablets. Call if you continue to have problems. If you have been taking narcotics for pain, before, during or after your surgery, you may be constipated. Take a laxative if necessary.   MEDICATION: You should resume your pre-surgery medications unless told not to. You may take oxybutynin or flomax if prescribed for bladder spasms or discomfort from the stent Take pain medication as directed for pain refractory to conservative management  PROBLEMS YOU SHOULD REPORT TO [place]: Fevers over 100.5 Fahrenheit. Heavy bleeding, or clots ( See above notes about blood in urine ). Inability to urinate. Drug reactions ( hives, rash, nausea, vomiting, diarrhea ). Severe burning or pain with urination that is not improving.

## 2021-07-24 NOTE — Anesthesia Postprocedure Evaluation (Signed)
Anesthesia Post Note  Patient: Lori Young  Procedure(s) Performed: CYSTOSCOPY RIGHT URETEROSCOPY/HOLMIUM LASER/STENT PLACEMENT (Right: Ureter)     Patient location during evaluation: PACU Anesthesia Type: General Level of consciousness: awake and alert Pain management: pain level controlled Vital Signs Assessment: post-procedure vital signs reviewed and stable Respiratory status: spontaneous breathing, nonlabored ventilation, respiratory function stable and patient connected to nasal cannula oxygen Cardiovascular status: blood pressure returned to baseline and stable Postop Assessment: no apparent nausea or vomiting Anesthetic complications: no   No notable events documented.  Last Vitals:  Vitals:   07/24/21 1615 07/24/21 1630  BP: 105/63 101/64  Pulse: 68 68  Resp: 19 18  Temp:  36.6 C  SpO2: 96% 98%    Last Pain:  Vitals:   07/24/21 1630  TempSrc:   PainSc: 0-No pain                 Trevor Iha

## 2021-07-24 NOTE — Interval H&P Note (Signed)
History and Physical Interval Note:  07/24/2021 2:24 PM  Lori Young  has presented today for surgery, with the diagnosis of RIGHT URETERAL STONE.  The various methods of treatment have been discussed with the patient and family. After consideration of risks, benefits and other options for treatment, the patient has consented to  Procedure(s): CYSTOSCOPY RIGHT URETEROSCOPY/HOLMIUM LASER/STENT PLACEMENT (Right) as a surgical intervention.  The patient's history has been reviewed, patient examined, no change in status, stable for surgery.  I have reviewed the patient's chart and labs.  Questions were answered to the patient's satisfaction.    Patient is status post ureteral stent placement.  She presents for definitive management of her stone.  Ray Church, III

## 2021-07-24 NOTE — Op Note (Signed)
Operative Note  Preoperative diagnosis:  1.  Right ureteral calculus  Postoperative diagnosis: 1.  Right ureteral calculus  Procedure(s): 1.  Cystoscopy with right retrograde pyelogram, right ureteroscopy with laser lithotripsy, ureteral stent exchange  Surgeon: Modena Slater, MD  Assistants: None  Anesthesia: General  Complications: None immediate  EBL: Minimal  Specimens: 1.  None  Drains/Catheters: 1.  6 x 24 double-J ureteral stent with a string  Intraoperative findings: 1.  Normal urethra and bladder 2.  Normal retrograde pyelogram with may be some mild fullness but no filling defects.  8 mm distal ureteral calculus laser fragmented to tiny fragments.  Indication: 35 year old female status post urgent right ureteral stent for a obstructing calculus and UTI presents for definitive management of her stone.  Description of procedure:  The patient was identified and consent was obtained.  The patient was taken to the operating room and placed in the supine position.  The patient was placed under general anesthesia.  Perioperative antibiotics were administered.  The patient was placed in dorsal lithotomy.  Patient was prepped and draped in a standard sterile fashion and a timeout was performed.  A 21 French rigid cystoscope was advanced into the urethra and into the bladder.  Complete cystoscopy was performed with no abnormal findings.  The stent was grasped and pulled just beyond the urethral meatus.  A sensor wire was advanced through this and up to the kidney under fluoroscopic guidance.  The stent was withdrawn and the wire was secured to the drape as a safety wire.  Semirigid ureteroscopy was performed and the stone was encountered which was laser fragmented on dust settings to tiny fragments.  I advanced the scope up to the renal pelvis and there were no other stone fragments.  I reinspected the ureter and did not see any obvious stone fragments that were clinically  significant.  There was some ureteral edema at the level of stone impaction.  Shot a retrograde pyelogram through the scope with the findings noted above.  I then withdrew the scope and placed a 6 x 24 double-J ureteral stent under fluoroscopic guidance with the string remaining in place.  Fluoroscopy confirmed proximal placement.  I advanced the scope in the bladder and confirmed distal placement of the stent.  I drained the bladder and withdrew the scope.  She tolerated the procedure well and was stable postoperative.  Plan: She will be instructed to remove her stent next Wednesday morning

## 2021-07-25 ENCOUNTER — Encounter (HOSPITAL_COMMUNITY): Payer: Self-pay | Admitting: Urology

## 2021-07-27 ENCOUNTER — Other Ambulatory Visit: Payer: Self-pay

## 2021-07-27 ENCOUNTER — Emergency Department (HOSPITAL_COMMUNITY)
Admission: EM | Admit: 2021-07-27 | Discharge: 2021-07-28 | Disposition: A | Discharge status: Home / Self Care | Payer: Self-pay | Attending: Emergency Medicine | Admitting: Emergency Medicine

## 2021-07-27 ENCOUNTER — Encounter (HOSPITAL_COMMUNITY): Payer: Self-pay

## 2021-07-27 ENCOUNTER — Emergency Department (HOSPITAL_COMMUNITY): Payer: Self-pay

## 2021-07-27 DIAGNOSIS — Z20822 Contact with and (suspected) exposure to covid-19: Secondary | ICD-10-CM | POA: Insufficient documentation

## 2021-07-27 DIAGNOSIS — R531 Weakness: Secondary | ICD-10-CM | POA: Insufficient documentation

## 2021-07-27 DIAGNOSIS — R0789 Other chest pain: Secondary | ICD-10-CM | POA: Insufficient documentation

## 2021-07-27 DIAGNOSIS — Z7982 Long term (current) use of aspirin: Secondary | ICD-10-CM | POA: Insufficient documentation

## 2021-07-27 DIAGNOSIS — R079 Chest pain, unspecified: Secondary | ICD-10-CM

## 2021-07-27 MED ORDER — SODIUM CHLORIDE 0.9 % IV BOLUS
1000.0000 mL | Freq: Once | INTRAVENOUS | Status: AC
  Administered 2021-07-28: 1000 mL via INTRAVENOUS

## 2021-07-27 MED ORDER — ONDANSETRON HCL 4 MG/2ML IJ SOLN
4.0000 mg | Freq: Once | INTRAMUSCULAR | Status: AC
  Administered 2021-07-28: 4 mg via INTRAVENOUS
  Filled 2021-07-27: qty 2

## 2021-07-27 NOTE — ED Provider Notes (Signed)
[facility]-EMERGENCY DEPT Provider Note   CSN: [id number] Arrival date & time: 07/27/21  2111     History Chief Complaint  Patient presents with   Weakness    Lori Young is a 35 y.o. female with a hx of kidney stones S/p cystoscopy with stent placement, migraines, anxiety, and depression who presents to the ED with complaints of generalized weakness x 4 days. Patient states she had a urologic procedure on Friday and afterwards she felt very weak/fatigued which has persisted throughout the weekend with intermittently lightheadedness & nausea. Today she started to have intermittent central chest pain described as sharp pressure, lasts a few minutes at a time, no specific triggers or alleviating/aggravating factors. Has also had some sneezing. Denies fever, chills, vomiting, diarrhea, dysuria, hematuria, vaginal bleeding, vaginal discharge, dyspnea, leg pain/swelling, hemoptysis, recent long travel, hormone use, personal hx of cancer, or hx of DVT/PE. Her stent is scheduled to be removed 07/29/21.    Interpretor utilized throughout Audiological scientist.   HPI     Past Medical History:  Diagnosis Date   Anginal pain (HCC)    Anxiety    Complication of anesthesia    Depression    History of kidney stones    PONV (postoperative nausea and vomiting)    Tachycardia     Patient Active Problem List   Diagnosis Date Noted   Nephrolithiasis 07/07/2021   GERD (gastroesophageal reflux disease) 03/11/2021   Migraines 03/11/2021    Past Surgical History:  Procedure Laterality Date   CESAREAN SECTION     CYSTOSCOPY WITH STENT PLACEMENT Right 07/07/2021   Procedure: CYSTOSCOPY WITH STENT PLACEMENT;  Surgeon: Crista Elliot, MD;  Location: [facility];  Service: Urology;  Laterality: Right;   CYSTOSCOPY/URETEROSCOPY/HOLMIUM LASER/STENT PLACEMENT Right 07/24/2021   Procedure: CYSTOSCOPY RIGHT URETEROSCOPY/HOLMIUM LASER/STENT PLACEMENT;  Surgeon: Crista Elliot, MD;   Location: [facility];  Service: Urology;  Laterality: Right;   TUBAL LIGATION       OB History   No obstetric history on file.     History reviewed. No pertinent family history.  Social History   Tobacco Use   Smoking status: Never   Smokeless tobacco: Never  Vaping Use   Vaping Use: Never used  Substance Use Topics   Alcohol use: Not Currently   Drug use: Never    Home Medications Prior to Admission medications   Medication Sig Start Date End Date Taking? Authorizing Provider  acetaminophen (TYLENOL) 500 MG tablet Take 1,000 mg by mouth every 6 (six) hours as needed for headache.    [provider]  aspirin-acetaminophen-caffeine (EXCEDRIN MIGRAINE) [phone] MG tablet Take 2 tablets by mouth daily as needed for headache or migraine.    [provider]  ibuprofen (ADVIL) 600 MG tablet Take 1 tablet (600 mg total) by mouth every 6 (six) hours as needed for mild pain or moderate pain. 07/07/21 08/06/21  Terald Sleeper, MD  omeprazole (PRILOSEC) 20 MG capsule Take 1 capsule (20 mg total) by mouth daily. 03/11/21   Grayce Sessions, NP  omeprazole (PRILOSEC) 20 MG capsule Take 20 mg by mouth daily.    [provider]  ondansetron (ZOFRAN ODT) 4 MG disintegrating tablet Take 1 tablet (4 mg total) by mouth every 8 (eight) hours as needed for nausea or vomiting. 06/18/21   Bill Salinas, PA-C  oxyCODONE-acetaminophen (PERCOCET/ROXICET) 5-325 MG tablet Take 1 tablet by mouth every 6 (six) hours as needed for up to 15 doses for  severe pain. 07/07/21   Terald Sleeperrifan, Matthew J, MD  tamsulosin (FLOMAX) 0.4 MG CAPS capsule Take 1 capsule (0.4 mg total) by mouth daily after breakfast. 06/18/21   Harlene SaltsMorelli, Brandon A, PA-C    Allergies    Lactose intolerance (gi)  Review of Systems   Review of Systems  Constitutional:  Positive for fatigue. Negative for chills and fever.  HENT:  Positive for sneezing. Negative for congestion, ear pain, rhinorrhea and sore throat.    Respiratory:  Negative for cough and shortness of breath.   Cardiovascular:  Positive for chest pain. Negative for leg swelling.  Gastrointestinal:  Positive for nausea. Negative for abdominal pain, blood in stool, constipation, diarrhea and vomiting.  Genitourinary:  Negative for dysuria and hematuria.  Neurological:  Positive for weakness (generalized) and light-headedness. Negative for syncope, numbness and headaches.  All other systems reviewed and are negative.  Physical Exam Updated Vital Signs BP 108/70 (BP Location: Left Arm)   Pulse 68   Temp 99.1 F (37.3 C) (Oral)   Resp 14   Wt 59.4 kg   LMP 07/20/2021   SpO2 100%   BMI 28.35 kg/m   Physical Exam Vitals and nursing note reviewed.  Constitutional:      General: She is not in acute distress.    Appearance: She is well-developed. She is not toxic-appearing.  HENT:     Head: Normocephalic and atraumatic.  Eyes:     General:        Right eye: No discharge.        Left eye: No discharge.     Conjunctiva/sclera: Conjunctivae normal.  Cardiovascular:     Rate and Rhythm: Normal rate and regular rhythm.  Pulmonary:     Effort: Pulmonary effort is normal. No respiratory distress.     Breath sounds: Normal breath sounds. No wheezing, rhonchi or rales.  Chest:     Chest wall: Tenderness (mild anterior chest wall) present.  Abdominal:     General: There is no distension.     Palpations: Abdomen is soft.     Tenderness: There is no abdominal tenderness. There is no right CVA tenderness, left CVA tenderness, guarding or rebound.  Musculoskeletal:     Cervical back: Neck supple.  Skin:    General: Skin is warm and dry.     Findings: No rash.  Neurological:     Mental Status: She is alert.     Comments: Clear speech.   Psychiatric:        Behavior: Behavior normal.    ED Results / Procedures / Treatments   Labs (all labs ordered are listed, but only abnormal results are displayed) Labs Reviewed  COMPREHENSIVE  METABOLIC PANEL - Abnormal; Notable for the following components:      Result Value   Glucose, Bld 115 (*)    All other components within normal limits  CBC WITH DIFFERENTIAL/PLATELET - Abnormal; Notable for the following components:   RBC 5.30 (*)    MCV 77.9 (*)    MCH 24.7 (*)    Abs Immature Granulocytes 0.13 (*)    All other components within normal limits  URINALYSIS, ROUTINE W REFLEX MICROSCOPIC - Abnormal; Notable for the following components:   APPearance CLOUDY (*)    Hgb urine dipstick MODERATE (*)    Leukocytes,Ua LARGE (*)    Bacteria, UA RARE (*)    All other components within normal limits  RESP PANEL BY RT-PCR (FLU A&B, COVID) ARPGX2  URINE CULTURE  LIPASE, BLOOD  D-DIMER, QUANTITATIVE  I-STAT BETA HCG BLOOD, ED ([facility], [facility], [facility] ONLY)  TROPONIN I (HIGH SENSITIVITY)  TROPONIN I (HIGH SENSITIVITY)    EKG None  EKG Interpretation  Date/Time: 07/28/21 @ 03:28   Ventricular Rate:  58 bpm  PR Interval:  129  QRS Duration:  101 QT Interval: 416   QTC Calculation:409   R Axis:  Normal    Text Interpretation: Sinus rhythm, no STEMI    Radiology DG Chest Portable 1 View  Result Date: 07/27/2021 CLINICAL DATA:  Pt states she has been feeling weak since her recent Cystoscopy sx on 07/24/21. Pt c/o LUQ chest pressure and feeling like she may pass out today. Nausea onset this a.m. as well. H/o Tachycardia. Nonsmoker. EXAM: PORTABLE CHEST 1 VIEW COMPARISON:  None. FINDINGS: The heart size and mediastinal contours are within normal limits. No focal consolidation. No pulmonary edema. No pleural effusion. No pneumothorax. No acute osseous abnormality. IMPRESSION: No active disease. Electronically Signed   By: Tish Frederickson M.D.   On: 07/27/2021 23:47    Procedures Procedures   Medications Ordered in ED Medications - No data to display  ED Course  I have reviewed the triage vital signs and the nursing notes.  Pertinent labs & imaging results that were available during my  care of the patient were reviewed by me and considered in my medical decision making (see chart for details).    MDM Rules/Calculators/A&P                          Patient presents to the ED with complaints of weakness & chest pain. Nontoxic, vitals without significant abnormality.    Additional history obtained:  Additional history obtained from chart review & nursing note review.   EKG: NSR  Lab Tests:  I Ordered, reviewed, and interpreted labs, which included:  CBC: No leukocytosis or anemia CMP: No significant electrolyte derangement, renal function and liver function tests are within normal limits Lipase: Within normal limits Troponins: No significant elevation, flat D-dimer: Within normal limits Urinalysis: Contaminated Pregnancy test: Negative  Imaging Studies ordered:  I ordered imaging studies which included chest x-ray, I independently reviewed, formal radiology impression shows:  No active disease  ED Course:  Labs are without significant electrolyte derangement, critical anemia, or acute renal or hepatic failure.  Chest x-ray is without pneumonia, pneumothorax, or findings of fluid overload.  Urine is contaminated, no urinary symptoms, however recent urologic procedure x2 therefore sent for culture.  EKG is without acute ischemia, troponins are flat, do not suspect ACS.  Patient is low risk [place], D-dimer is negative, doubt pulmonary embolism.  COVID/flu testing are negative.  Overall unclear definitive etiology to patient's symptoms, her chest pain does have some reproducibility with chest wall palpation, her emergency department work-up has been reassuring, she is feeling somewhat improved status post IV fluids and is tolerating p.o. after Zofran, feel she is reasonable for discharge home at this time with close PCP/urology follow-up.  Portions of this note were generated with Scientist, clinical (histocompatibility and immunogenetics). Dictation errors may occur despite best attempts at  proofreading.  Final Clinical Impression(s) / ED Diagnoses Final diagnoses:  Weakness    Rx / DC Orders ED Discharge Orders          Ordered    ondansetron (ZOFRAN ODT) 4 MG disintegrating tablet  Every 8 hours PRN        07/28/21 0354  Cherly Anderson, PA-C 07/28/21 0411    Geoffery Lyons, MD 07/28/21 [phone]

## 2021-07-27 NOTE — ED Triage Notes (Signed)
Interpreter used for triage.  Pt reports surgery last Friday to remove a kidney stone. Pt states since then she has been feeling weak.   Pt states she started having left chest pressure and feeling like she may pass out today. Denies radiation of chest pressure Pt reports nausea that started this morning.

## 2021-07-28 ENCOUNTER — Other Ambulatory Visit: Payer: Self-pay

## 2021-07-28 LAB — URINALYSIS, ROUTINE W REFLEX MICROSCOPIC
Bilirubin Urine: NEGATIVE
Glucose, UA: NEGATIVE mg/dL
Ketones, ur: NEGATIVE mg/dL
Nitrite: NEGATIVE
Protein, ur: NEGATIVE mg/dL
Specific Gravity, Urine: 1.009 (ref 1.005–1.030)
pH: 7 (ref 5.0–8.0)

## 2021-07-28 LAB — D-DIMER, QUANTITATIVE: D-Dimer, Quant: 0.43 ug/mL-FEU (ref 0.00–0.50)

## 2021-07-28 LAB — CBC WITH DIFFERENTIAL/PLATELET
Abs Immature Granulocytes: 0.13 10*3/uL — ABNORMAL HIGH (ref 0.00–0.07)
Basophils Absolute: 0 10*3/uL (ref 0.0–0.1)
Basophils Relative: 0 %
Eosinophils Absolute: 0.3 10*3/uL (ref 0.0–0.5)
Eosinophils Relative: 3 %
HCT: 41.3 % (ref 36.0–46.0)
Hemoglobin: 13.1 g/dL (ref 12.0–15.0)
Immature Granulocytes: 1 %
Lymphocytes Relative: 21 %
Lymphs Abs: 1.9 10*3/uL (ref 0.7–4.0)
MCH: 24.7 pg — ABNORMAL LOW (ref 26.0–34.0)
MCHC: 31.7 g/dL (ref 30.0–36.0)
MCV: 77.9 fL — ABNORMAL LOW (ref 80.0–100.0)
Monocytes Absolute: 0.9 10*3/uL (ref 0.1–1.0)
Monocytes Relative: 9 %
Neutro Abs: 6 10*3/uL (ref 1.7–7.7)
Neutrophils Relative %: 66 %
Platelets: 362 10*3/uL (ref 150–400)
RBC: 5.3 MIL/uL — ABNORMAL HIGH (ref 3.87–5.11)
RDW: 13.7 % (ref 11.5–15.5)
WBC: 9.2 10*3/uL (ref 4.0–10.5)
nRBC: 0 % (ref 0.0–0.2)

## 2021-07-28 LAB — COMPREHENSIVE METABOLIC PANEL
ALT: 33 U/L (ref 0–44)
AST: 24 U/L (ref 15–41)
Albumin: 4.3 g/dL (ref 3.5–5.0)
Alkaline Phosphatase: 68 U/L (ref 38–126)
Anion gap: 11 (ref 5–15)
BUN: 9 mg/dL (ref 6–20)
CO2: 25 mmol/L (ref 22–32)
Calcium: 9.2 mg/dL (ref 8.9–10.3)
Chloride: 101 mmol/L (ref 98–111)
Creatinine, Ser: 0.57 mg/dL (ref 0.44–1.00)
GFR, Estimated: >60 mL/min (ref >60)
Glucose, Bld: 115 mg/dL — ABNORMAL HIGH (ref 70–99)
Potassium: 3.8 mmol/L (ref 3.5–5.1)
Sodium: 137 mmol/L (ref 135–145)
Total Bilirubin: 0.6 mg/dL (ref 0.3–1.2)
Total Protein: 7.9 g/dL (ref 6.5–8.1)

## 2021-07-28 LAB — I-STAT BETA HCG BLOOD, ED (MC, WL, AP ONLY): I-stat hCG, quantitative: <5 m[IU]/mL (ref <5)

## 2021-07-28 LAB — RESP PANEL BY RT-PCR (FLU A&B, COVID) ARPGX2
Influenza A by PCR: NEGATIVE
Influenza B by PCR: NEGATIVE
SARS Coronavirus 2 by RT PCR: NEGATIVE

## 2021-07-28 LAB — LIPASE, BLOOD: Lipase: 37 U/L (ref 11–51)

## 2021-07-28 LAB — TROPONIN I (HIGH SENSITIVITY)
Troponin I (High Sensitivity): <2 ng/L (ref <18)
Troponin I (High Sensitivity): <2 ng/L (ref <18)

## 2021-07-28 MED ORDER — ONDANSETRON 4 MG PO TBDP: 4.0000 mg | ORAL_TABLET | Freq: Three times a day (TID) | ORAL | 0 refills | Status: DC | PRN

## 2021-07-28 MED ORDER — SODIUM CHLORIDE 0.9 % IV BOLUS
1000.0000 mL | Freq: Once | INTRAVENOUS | Status: AC
  Administered 2021-07-28: 1000 mL via INTRAVENOUS

## 2021-07-28 NOTE — ED Notes (Signed)
Pt provided water pitcher and cup per fluid challenge. Pt was informed to have a few sips.

## 2021-07-28 NOTE — ED Notes (Signed)
No concerns. 

## 2021-07-28 NOTE — Discharge Instructions (Addendum)
You were seen in the ER today for weakness & chest pain.  Your work up was overall reassuring.  Your chest xray was normal.  Please take zofran as needed.   We have prescribed you new medication(s) today. Discuss the medications prescribed today with your pharmacist as they can have adverse effects and interactions with your other medicines including over the counter and prescribed medications. Seek medical evaluation if you start to experience new or abnormal symptoms after taking one of these medicines, seek care immediately if you start to experience difficulty breathing, feeling of your throat closing, facial swelling, or rash as these could be indications of a more serious allergic reaction  Follow up with primary care and urology as soon as possible.  Return to the ER for new or worsening symptoms including but not limited to new or worsening pain, fever, passing out, inability to keep fluids down, or any other concerns.   English to Spanish Google Translate:  Lori Young lo vieron en la sala de emergencias por debilidad y Journalist, newspaper. Su trabajo fue en general tranquilizador. Su radiografa de trax fue normal. Tome zofran segn sea necesario.  Le hemos recetado nuevos medicamentos hoy. Hable sobre los medicamentos recetados hoy con su farmacutico, ya que pueden tener efectos adversos e interacciones con sus otros medicamentos, incluidos los medicamentos recetados y de [place]. Busque una evaluacin mdica si comienza a experimentar sntomas nuevos o anormales despus de tomar uno de estos medicamentos, busque atencin mdica de inmediato si comienza a experimentar dificultad para respirar, sensacin de que se le cierra la garganta, hinchazn facial o sarpullido, ya que estos podran ser indicios de una enfermedad ms grave. reaccin alrgica  Seguimiento con atencin primaria y urologa tan pronto como sea posible. Regrese a la sala de emergencias por sntomas nuevos o que [place],  incluidos, entre otros, dolor nuevo o que [place], [place], [place], incapacidad para retener lquidos o cualquier otra inquietud.

## 2021-07-29 LAB — URINE CULTURE: Culture: 20000 — AB

## 2022-01-19 ENCOUNTER — Ambulatory Visit: Payer: Self-pay | Attending: Critical Care Medicine

## 2022-01-19 ENCOUNTER — Other Ambulatory Visit: Payer: Self-pay

## 2022-02-01 ENCOUNTER — Ambulatory Visit (INDEPENDENT_AMBULATORY_CARE_PROVIDER_SITE_OTHER): Payer: Self-pay | Admitting: Primary Care

## 2022-02-09 ENCOUNTER — Telehealth: Payer: Self-pay | Admitting: Primary Care

## 2022-02-09 NOTE — Telephone Encounter (Signed)
Pt was sent a letter from financial dept. Inform them, that the application they submitted was incomplete, since they were missing some documentation at the time of the appointment, Pt need to reschedule and resubmit all new papers and application for [employer] and [place], P.S. old documents has been sent back by mail to the Pt and Pt. need to make a new appt. 

## 2022-02-15 ENCOUNTER — Other Ambulatory Visit: Payer: Self-pay

## 2022-02-15 ENCOUNTER — Encounter (INDEPENDENT_AMBULATORY_CARE_PROVIDER_SITE_OTHER): Payer: Self-pay | Admitting: Primary Care

## 2022-02-15 ENCOUNTER — Ambulatory Visit (INDEPENDENT_AMBULATORY_CARE_PROVIDER_SITE_OTHER): Payer: Self-pay | Admitting: Primary Care

## 2022-02-15 VITALS — BP 103/67 | HR 96 | Temp 98.6°F | Ht <= 58 in | Wt 135.8 lb

## 2022-02-15 DIAGNOSIS — Z124 Encounter for screening for malignant neoplasm of cervix: Secondary | ICD-10-CM

## 2022-02-15 DIAGNOSIS — R1031 Right lower quadrant pain: Secondary | ICD-10-CM

## 2022-02-15 DIAGNOSIS — K219 Gastro-esophageal reflux disease without esophagitis: Secondary | ICD-10-CM

## 2022-02-15 MED ORDER — OMEPRAZOLE 20 MG PO CPDR
20.0000 mg | DELAYED_RELEASE_CAPSULE | Freq: Every day | ORAL | 3 refills | Status: AC
  Filled 2022-02-15: qty 30, 30d supply, fill #0

## 2022-02-15 NOTE — Progress Notes (Signed)
[facility]  Subjective:     Lori Young is a Hispanic 36 y.o. Hispanic  female ( interpreter April [id number]) who presents for evaluation of abdominal pain. Onset was 3 months ago. Symptoms have been gradually worsening. The pain is described as sharp, and is 7/10 in intensity. Pain is located in the RUQ with radiation to right leg..  Aggravating factors: coffee and recumbency.  Alleviating factors: none. Associated symptoms: nausea. The patient denies constipation, diarrhea, dysuria, fever, and headache.  The patient's history has been marked as reviewed and updated as appropriate. allergies, current medications, past family history, past medical history, past social history, and past surgical history Past Medical History:  Diagnosis Date   Anginal pain ([place])    Anxiety    Complication of anesthesia    Depression    History of kidney stones    PONV (postoperative nausea and vomiting)    Tachycardia    Patient Active Problem List   Diagnosis Date Noted   Nephrolithiasis 07/07/2021   GERD (gastroesophageal reflux disease) 03/11/2021   Migraines 03/11/2021   Past Surgical History:  Procedure Laterality Date   CESAREAN SECTION     CYSTOSCOPY WITH STENT PLACEMENT Right 07/07/2021   Procedure: CYSTOSCOPY WITH STENT PLACEMENT;  Surgeon: Lucas Mallow, MD;  Location: [facility];  Service: Urology;  Laterality: Right;   CYSTOSCOPY/URETEROSCOPY/HOLMIUM LASER/STENT PLACEMENT Right 07/24/2021   Procedure: CYSTOSCOPY RIGHT URETEROSCOPY/HOLMIUM LASER/STENT PLACEMENT;  Surgeon: Lucas Mallow, MD;  Location: [facility];  Service: Urology;  Laterality: Right;   TUBAL LIGATION     No family history on file.   Current Outpatient Medications  Medication Sig Dispense Refill   acetaminophen (TYLENOL) 500 MG tablet Take 1,000 mg by mouth every 6 (six) hours as needed for headache.     aspirin-acetaminophen-caffeine (EXCEDRIN MIGRAINE) 250-250-65 MG tablet Take 2  tablets by mouth daily as needed for headache or migraine.     omeprazole (PRILOSEC) 20 MG capsule Take 1 capsule (20 mg total) by mouth daily. 30 capsule 3   ondansetron (ZOFRAN ODT) 4 MG disintegrating tablet Take 1 tablet (4 mg total) by mouth every 8 (eight) hours as needed for nausea or vomiting. 8 tablet 0   oxyCODONE-acetaminophen (PERCOCET/ROXICET) 5-325 MG tablet Take 1 tablet by mouth every 6 (six) hours as needed for up to 15 doses for severe pain. 15 tablet 0   tamsulosin (FLOMAX) 0.4 MG CAPS capsule Take 1 capsule (0.4 mg total) by mouth daily after breakfast. 10 capsule 0   No current facility-administered medications for this visit.   Allergies: Lactose intolerance (gi)  Review of Systems Pertinent items noted in HPI and remainder of comprehensive ROS otherwise negative.     Objective:    BP 103/67 (BP Location: Right Arm, Patient Position: Sitting, Cuff Size: Normal)    Pulse 96    Temp 98.6 F (37 C) (Oral)    Ht 4\' 9"  (1.448 m)    Wt 135 lb 12.8 oz (61.6 kg)    SpO2 95%    BMI 29.39 kg/m  General appearance: alert, cooperative, appears stated age, and no distress Head: Normocephalic, without obvious abnormality, atraumatic Eyes: conjunctivae/corneas clear. PERRL, EOM's intact. Fundi benign. Back: symmetric, no curvature. ROM normal. No CVA tenderness. Lungs: clear to auscultation bilaterally Heart: regular rate and rhythm, S1, S2 normal, no murmur, click, rub or gallop Abdomen: soft, non-tender; bowel sounds normal; no masses,  no organomegaly Extremities: extremities normal, atraumatic, no cyanosis or  edema Skin: Skin color, texture, turgor normal. No rashes or lesions Lymph nodes: Cervical, supraclavicular, and axillary nodes normal. Neurologic: Alert and oriented X 3, normal strength and tone. Normal symmetric reflexes. Normal coordination and gait    Assessment:  Shawnessy was seen today for abdominal pain.  Diagnoses and all orders for this  visit:  Gastroesophageal reflux disease without esophagitis Discussed eating small frequent meal, reduction in acidic foods, fried foods ,spicy foods, alcohol caffeine and tobacco and certain medications. Avoid laying down after eating 26mins-1hour, elevated head of the bed.   Right lower quadrant abdominal pain  liver, right kidney, gallbladder, pancreas, and large and small intestine.   This note has been created with Surveyor, quantity. Any transcriptional errors are unintentional.   Kerin Perna, NP 02/15/2022, 4:06 PM

## 2022-02-15 NOTE — Patient Instructions (Signed)
Opciones de alimentos para pacientes adultos con enfermedad de reflujogastroesofgico Food Choices for Gastroesophageal Reflux Disease, Adult Si tiene enfermedad de reflujo gastroesofgico (ERGE), los alimentos que consume y los hbitos de alimentacin son muy importantes. Elegir los alimentos adecuados puede ayudar a aliviar las molestias. Piense en consultar a un experto en alimentacin (nutricionista) para que lo ayude a hacer buenas elecciones. Consejos para seguir este plan Leer las etiquetas de los alimentos Elija alimentos que tengan bajo contenido de grasas saturadas. Los alimentos que pueden ayudar con los sntomas incluyen los siguientes: Alimentos que tienen menos del 5 % de los valores diarios (VD) de grasa. Alimentos que tienen 0 gramos de grasas trans. Al cocinar No frer los alimentos. Cocinar la comida al horno, al vapor, a la plancha o en la parrilla. Estos son mtodos que no necesitan mucha grasa para cocinar. Para agregar sabor, trate de consumir hierbas con bajo contenido de picante y acidez. Planificacin de las comidas  Elegir alimentos saludables con bajo contenido de grasa, por ejemplo: Frutas y vegetales. Cereales integrales. Productos lcteos con bajo contenido de grasa. Carne magra, pescado y aves. Haga comidas pequeas frecuentes en lugar de 3 comidas abundantes al da. Coma lentamente, en un lugar donde est relajado. Evite agacharse o recostarse hasta 2 o 3 horas despus de haber comido. Limite los alimentos con alto contenido graso como las carnes grasas o los alimentos fritos. Limite su ingesta de alimentos grasos, como aceites, manteca y margarina. Evite lo siguiente si el mdico se lo indica: Consumir alimentos que le ocasionen sntomas. Pueden ser distintos para cada persona. Lleve un registro de los alimentos para identificar aquellos que le causen sntomas. Alcohol. Beber mucha cantidad de lquido con las comidas. Comer 2 o 3 horas antes de  acostarse.  Estilo de vida Mantenga un peso saludable. Pregntele a su mdico cul es el peso saludable para usted. Si necesita perder peso, hable con su mdico para hacerlo de manera segura. Haga actividad fsica durante, al menos, 30 minutos 5 das por semana o ms, o como se lo haya indicado el mdico. Use ropa suelta. No fume ni consuma ningn producto que contenga nicotina o tabaco. Si necesita ayuda para dejar de fumar, consulte al mdico. Duerma con la cabecera de la cama ms elevada que los pies. Use una cua debajo del colchn o bloques debajo del armazn de la cama para mantener la cabecera de la cama elevada. Mastique chicle sin azcar despus de las comidas. Qu alimentos debo comer?  Siga una dieta saludable y bien equilibrada que incluya abundantes frutas, verduras, cereales integrales, productos lcteos descremados, carnes magras,pescado y aves. Cada persona es diferente. Los alimentos que pueden causar sntomas en una persona pueden no causar sntomas en otra persona. Trabaje con el mdico para hallar alimentos que seanseguros para usted. Es posible que los productos que se enumeran ms arriba no constituyan una lista completa de lo que puede comer y beber. Pngase en contacto con un experto en alimentos para conocer ms opciones. Qu alimentos debo evitar? Limitar algunos de estos alimentos puede ayudar a controlar los sntomas de ERGE. Cada persona es diferente. Consulte a un experto en alimentacin o a su mdico para que lo ayude a encontrar los alimentos exactos que debe evitar, silos hubiere. Frutas Cualquier fruta que est preparada con grasa agregada. Cualquier fruta que le ocasione sntomas. Para algunas personas, estas pueden incluir, las frutasctricas como naranja, pomelo, pia y limn. Verduras Verduras fritas en abundante aceite. Papas fritas. Cualquier verdura que est   preparada con grasa agregada. Cualquier verdura que le ocasione sntomas. Para algunas personas,  estas pueden incluir tomates y productos con tomate, ajes,cebollas y ajo, y rbanos picantes. Granos Pasteles o panes sin levadura con grasa agregada. Carnes y otras protenas Carnes de alto contenido graso como carne grasa de vaca o cerdo, salchichas, costillas, jamn, salchicha, salame y tocino. Carnes o protenas fritas, lo que incluye pescado frito y pollo frito. Frutos secos y mantequillas de frutossecos, en grandes cantidades. Lcteos Leche entera y leche con chocolate. Crema agria. Crema. Helado. Queso crema.Batidos con leche. Grasas y aceites Mantequilla. Margarina. Lardo. Mantequilla clarificada. Bebidas Caf y t negro, con o sin cafena. Bebidas con gas. Refrescos. Bebidas energizantes. Jugo de fruta hecho con frutas cidas, como naranja o pomelo.Jugo de tomate. Bebidas alcohlicas. Dulces y postres Chocolate y cacao. Rosquillas. Alios y condimentos Pimienta. Menta y mentol. Sal agregada. Cualquier condimento, hierbas o aderezos que le ocasionen sntomas. Para algunas personas, esto puede incluircurry, salsa picante o aderezos para ensalada a base de vinagre. Es posible que los productos que se enumeran ms arriba no constituyan una lista completa de lo que no debera comer y beber. Pngase en contacto con un experto en alimentos para conocer ms opciones. Preguntas para hacerle al mdico Los cambios en la dieta y en el estilo de vida a menudo son los primeros pasos que se toman para manejar los sntomas de ERGE. Si los cambios en la dieta y elestilo de vida no ayudan, hable con el mdico sobre el uso de medicamentos. Dnde buscar ms informacin International Foundation for Gastrointestinal Disorders (Fundacin Internacional para los Trastornos Gastrointestinales): aboutgerd.org Resumen Si tiene ERGE, las elecciones de alimentos y el estilo de vida son muy importantes para ayudar a aliviar los sntomas. Haga comidas pequeas durante el da en lugar de 3 comidas abundantes. Coma  lentamente y en un lugar donde est relajado. Evite agacharse o recostarse hasta 2 o 3 horas despus de haber comido. Limite los alimentos con alto contenido graso como las carnes grasas o los alimentos fritos. Esta informacin no tiene como fin reemplazar el consejo del mdico. Asegresede hacerle al mdico cualquier pregunta que tenga. Document Revised: 07/25/2020 Document Reviewed: 07/25/2020 Elsevier Patient Education  2022 [employer].  

## 2022-02-16 ENCOUNTER — Other Ambulatory Visit: Payer: Self-pay

## 2022-02-23 ENCOUNTER — Other Ambulatory Visit: Payer: Self-pay

## 2022-04-19 IMAGING — CT CT ABD-PELV W/ CM
2 of 4 series · 16 of 46 positions shown, 18 images · IV contrast (Omni 300)
Comparison: None.

CLINICAL DATA: Abdominal abscess/infection suspected RLQ abdominal
pain, appendicitis suspected (Age >= 14y) RLQ abd pain

EXAM:
CT ABDOMEN AND PELVIS WITH CONTRAST
TECHNIQUE: Multidetector CT imaging of the abdomen and pelvis was performed
using the standard protocol following bolus administration of
intravenous contrast.
CONTRAST:  100mL OMNIPAQUE IOHEXOL 300 MG/ML  SOLN

[Series 3: a/p w/ 5mm · axial · 0.98mm/px · z∈[+865,+1305]mm · 13 of 96 slices shown, 15 images]
[im 4/96  soft-tissue]
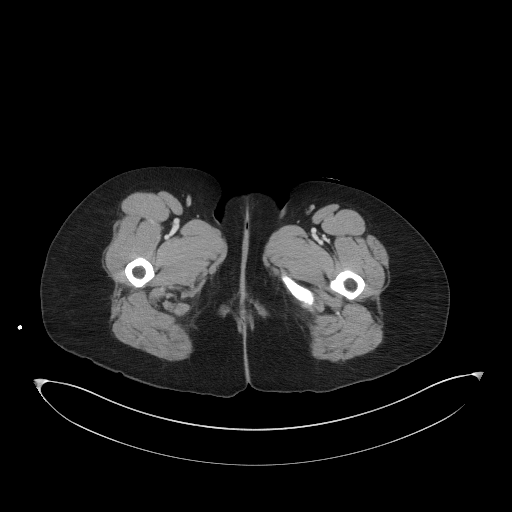
[im 4/96  bone]
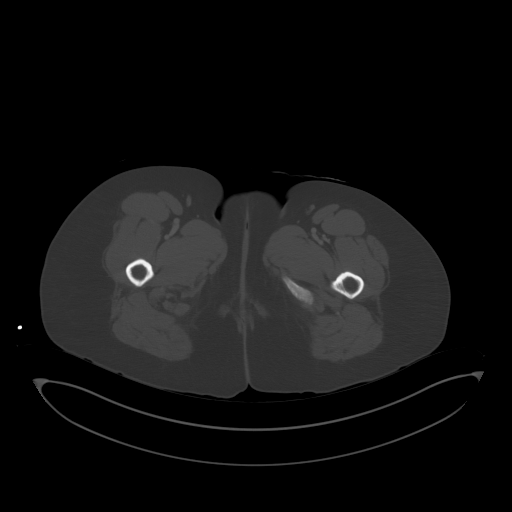
[im 12/96  soft-tissue]
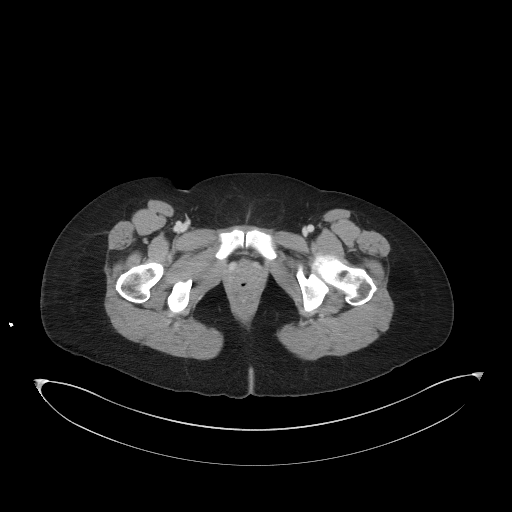
[im 20/96  soft-tissue]
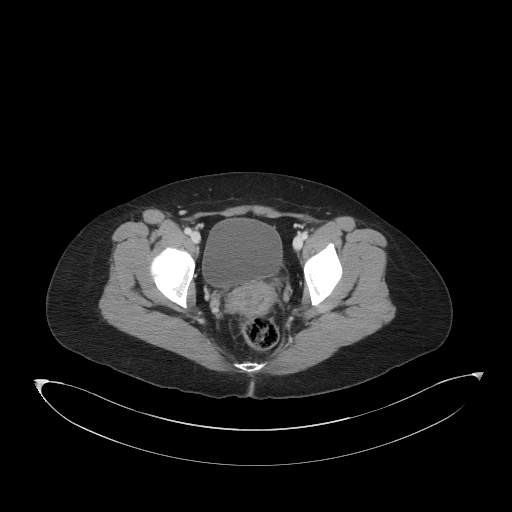
[im 27/96  soft-tissue]
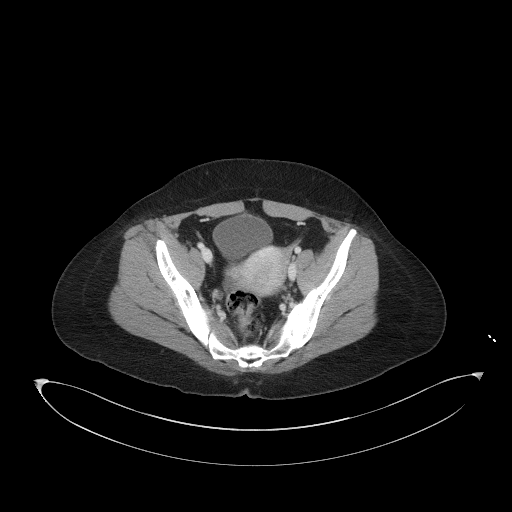
[im 35/96  soft-tissue]
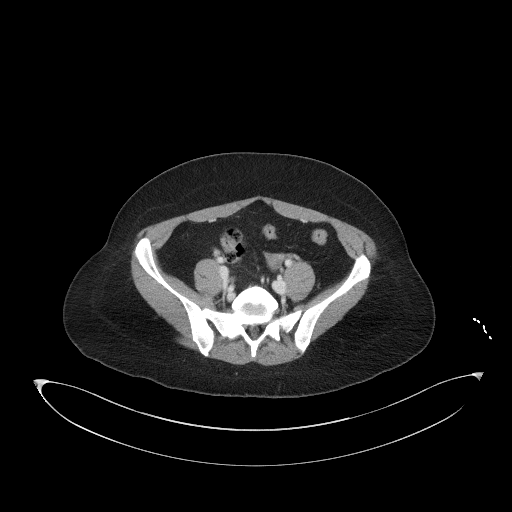
[im 42/96  soft-tissue]
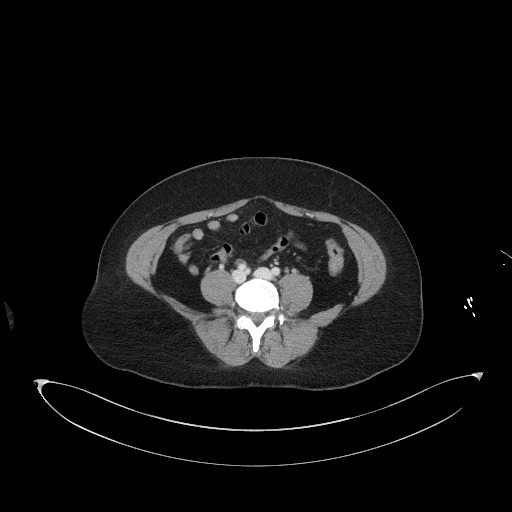
[im 50/96  soft-tissue]
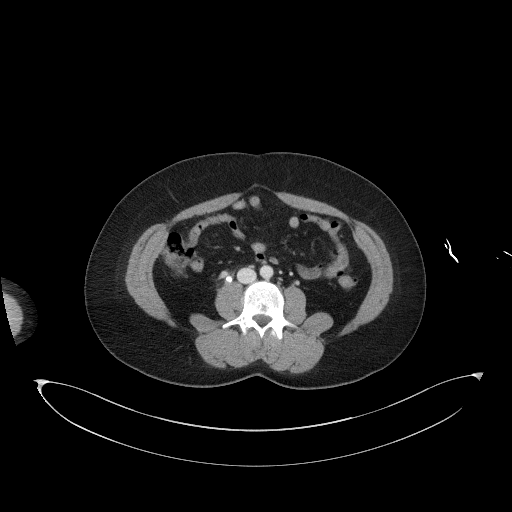
[im 54/96  soft-tissue]
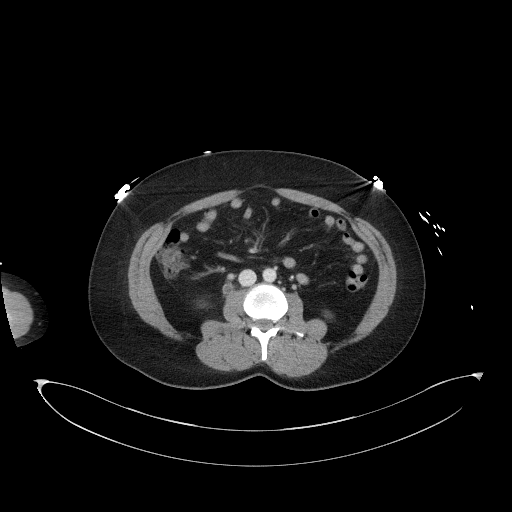
[im 61/96  soft-tissue]
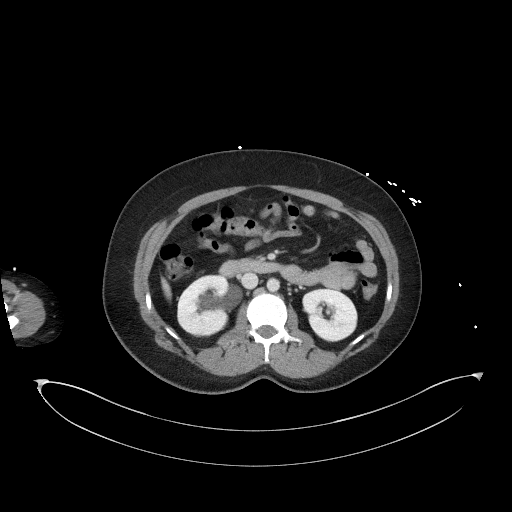
[im 61/96  bone]
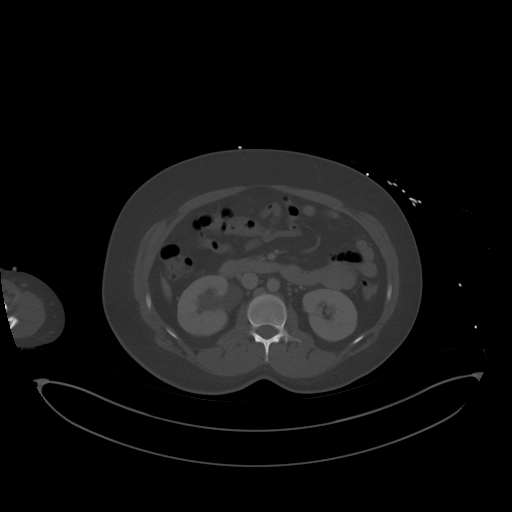
[im 69/96  soft-tissue]
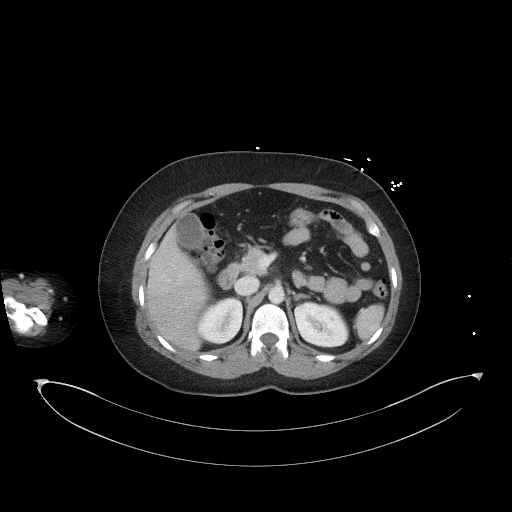
[im 77/96  soft-tissue]
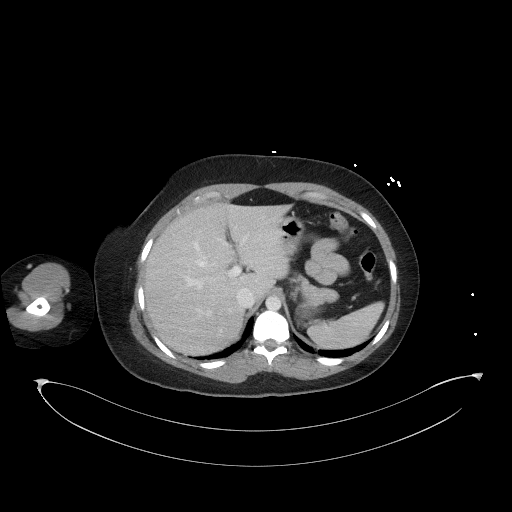
[im 84/96  soft-tissue]
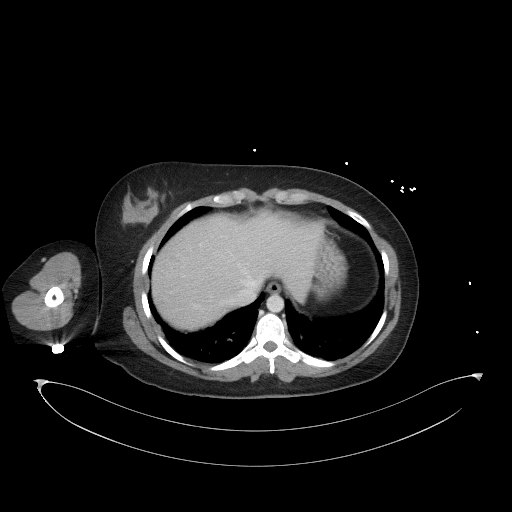
[im 92/96  soft-tissue]
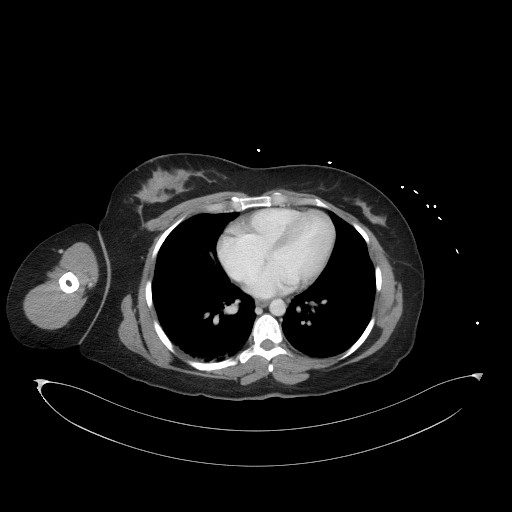

[Series 6: a/p w/ cor · coronal · 0.79mm/px · 3 of 122 slices shown]
[im 41/122  soft-tissue]
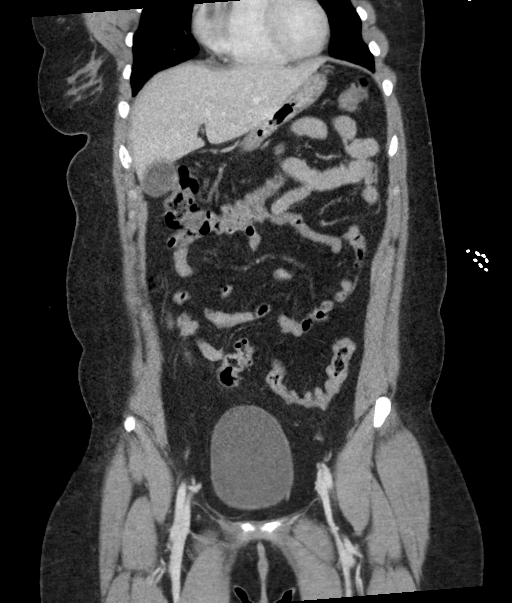
[im 54/122  soft-tissue]
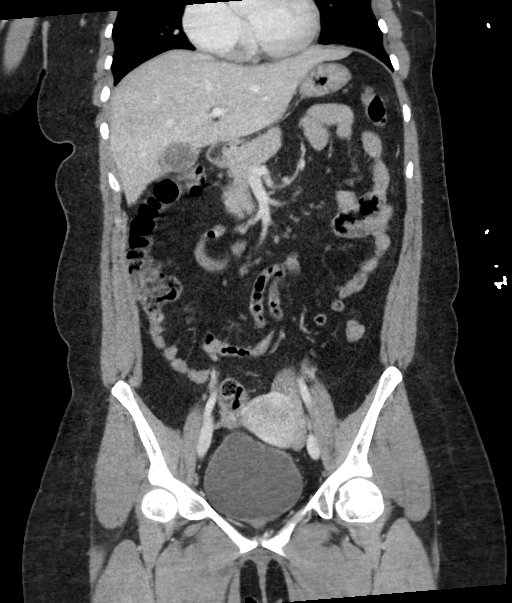
[im 68/122  soft-tissue]
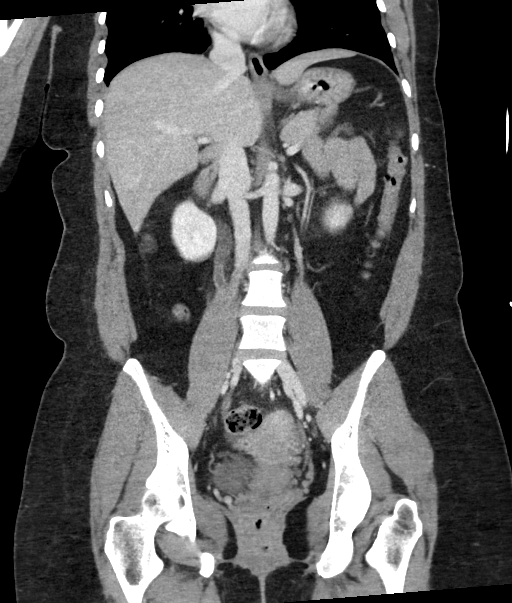

[16 of 46 positions shown; findings below may reference images not displayed]

FINDINGS: Lower chest: Bibasilar hypoventilatory changes. Anterior right
middle lobe atelectasis.

Hepatobiliary: No focal liver abnormality is seen. There are
probable radiolucent stones within the gallbladder. No evidence of
cholecystitis.

Pancreas: Unremarkable. No pancreatic ductal dilatation or
surrounding inflammatory changes.

Spleen: Normal in size without focal abnormality.

Adrenals/Urinary Tract: Adrenal glands are unremarkable. There is
right-sided hydroureteronephrosis with obstructing 6 mm stone in the
mid ureter (coronal image 66). There is mild ureteral enhancement
and periureteral stranding proximally. There is a tiny right lower
pole renal cyst. The bladder is unremarkable. There is a small left
renal cyst, otherwise left kidney and ureter are unremarkable.

Stomach/Bowel: No evidence of bowel obstruction. Normal appendix.
Scattered colonic diverticuli.

Vascular/Lymphatic: No significant vascular findings are present. No
enlarged abdominal or pelvic lymph nodes.

Reproductive: Unremarkable.

Other: No abdominal wall hernia or abnormality. No abdominopelvic
ascites.

Musculoskeletal: No acute or significant osseous findings.
IMPRESSION: Obstructing 6 mm stone in the mid right ureter with upstream
hydroureteronephrosis, mild ureteral enhancement and periureteral
stranding proximally.

Normal appendix.

Probable radiolucent stones within the gallbladder, consider
nonemergent right upper quadrant ultrasound to confirm. No evidence
of cholecystitis.

## 2022-10-24 ENCOUNTER — Encounter (HOSPITAL_COMMUNITY): Payer: Self-pay | Admitting: Emergency Medicine

## 2022-10-24 ENCOUNTER — Emergency Department (HOSPITAL_COMMUNITY): Payer: Self-pay

## 2022-10-24 ENCOUNTER — Emergency Department (HOSPITAL_COMMUNITY)
Admission: EM | Admit: 2022-10-24 | Discharge: 2022-10-25 | Disposition: A | Discharge status: Home / Self Care | Payer: Self-pay | Attending: Emergency Medicine | Admitting: Emergency Medicine

## 2022-10-24 DIAGNOSIS — Z7982 Long term (current) use of aspirin: Secondary | ICD-10-CM | POA: Insufficient documentation

## 2022-10-24 DIAGNOSIS — R0789 Other chest pain: Secondary | ICD-10-CM | POA: Insufficient documentation

## 2022-10-24 DIAGNOSIS — K8020 Calculus of gallbladder without cholecystitis without obstruction: Secondary | ICD-10-CM | POA: Insufficient documentation

## 2022-10-24 LAB — CBC WITH DIFFERENTIAL/PLATELET
Abs Immature Granulocytes: 0.03 10*3/uL (ref 0.00–0.07)
Basophils Absolute: 0 10*3/uL (ref 0.0–0.1)
Basophils Relative: 0 %
Eosinophils Absolute: 0.1 10*3/uL (ref 0.0–0.5)
Eosinophils Relative: 2 %
HCT: 40.1 % (ref 36.0–46.0)
Hemoglobin: 13.1 g/dL (ref 12.0–15.0)
Immature Granulocytes: 0 %
Lymphocytes Relative: 25 %
Lymphs Abs: 2 10*3/uL (ref 0.7–4.0)
MCH: 26.4 pg (ref 26.0–34.0)
MCHC: 32.7 g/dL (ref 30.0–36.0)
MCV: 80.7 fL (ref 80.0–100.0)
Monocytes Absolute: 0.6 10*3/uL (ref 0.1–1.0)
Monocytes Relative: 8 %
Neutro Abs: 5 10*3/uL (ref 1.7–7.7)
Neutrophils Relative %: 65 %
Platelets: 330 10*3/uL (ref 150–400)
RBC: 4.97 MIL/uL (ref 3.87–5.11)
RDW: 13.6 % (ref 11.5–15.5)
WBC: 7.8 10*3/uL (ref 4.0–10.5)
nRBC: 0 % (ref 0.0–0.2)

## 2022-10-24 LAB — URINALYSIS, ROUTINE W REFLEX MICROSCOPIC
Bacteria, UA: NONE SEEN
Bilirubin Urine: NEGATIVE
Glucose, UA: NEGATIVE mg/dL
Ketones, ur: NEGATIVE mg/dL
Leukocytes,Ua: NEGATIVE
Nitrite: NEGATIVE
Protein, ur: NEGATIVE mg/dL
Specific Gravity, Urine: 1.018 (ref 1.005–1.030)
pH: 8 (ref 5.0–8.0)

## 2022-10-24 LAB — COMPREHENSIVE METABOLIC PANEL
ALT: 45 U/L — ABNORMAL HIGH (ref 0–44)
AST: 29 U/L (ref 15–41)
Albumin: 3.9 g/dL (ref 3.5–5.0)
Alkaline Phosphatase: 56 U/L (ref 38–126)
Anion gap: 8 (ref 5–15)
BUN: 7 mg/dL (ref 6–20)
CO2: 24 mmol/L (ref 22–32)
Calcium: 8.7 mg/dL — ABNORMAL LOW (ref 8.9–10.3)
Chloride: 108 mmol/L (ref 98–111)
Creatinine, Ser: 0.64 mg/dL (ref 0.44–1.00)
GFR, Estimated: >60 mL/min (ref >60)
Glucose, Bld: 107 mg/dL — ABNORMAL HIGH (ref 70–99)
Potassium: 4.1 mmol/L (ref 3.5–5.1)
Sodium: 140 mmol/L (ref 135–145)
Total Bilirubin: 0.3 mg/dL (ref 0.3–1.2)
Total Protein: 6.9 g/dL (ref 6.5–8.1)

## 2022-10-24 LAB — LIPASE, BLOOD: Lipase: 42 U/L (ref 11–51)

## 2022-10-24 LAB — I-STAT BETA HCG BLOOD, ED (MC, WL, AP ONLY): I-stat hCG, quantitative: <5 m[IU]/mL (ref <5)

## 2022-10-24 LAB — TROPONIN I (HIGH SENSITIVITY)
Troponin I (High Sensitivity): 3 ng/L (ref <18)
Troponin I (High Sensitivity): 4 ng/L (ref <18)

## 2022-10-24 MED ORDER — IOHEXOL 350 MG/ML SOLN
75.0000 mL | Freq: Once | INTRAVENOUS | Status: AC | PRN
  Administered 2022-10-24: 75 mL via INTRAVENOUS

## 2022-10-24 MED ORDER — MORPHINE SULFATE (PF) 4 MG/ML IV SOLN
4.0000 mg | Freq: Once | INTRAVENOUS | Status: AC
  Administered 2022-10-24: 4 mg via INTRAVENOUS
  Filled 2022-10-24: qty 1

## 2022-10-24 MED ORDER — ONDANSETRON HCL 4 MG/2ML IJ SOLN
4.0000 mg | Freq: Once | INTRAMUSCULAR | Status: AC
  Administered 2022-10-24: 4 mg via INTRAVENOUS
  Filled 2022-10-24: qty 2

## 2022-10-24 MED ORDER — LACTATED RINGERS IV BOLUS
1000.0000 mL | Freq: Once | INTRAVENOUS | Status: AC
  Administered 2022-10-24: 1000 mL via INTRAVENOUS

## 2022-10-24 MED ORDER — LIDOCAINE VISCOUS HCL 2 % MT SOLN
15.0000 mL | Freq: Once | OROMUCOSAL | Status: AC
  Administered 2022-10-24: 15 mL via ORAL
  Filled 2022-10-24: qty 15

## 2022-10-24 MED ORDER — ALUM & MAG HYDROXIDE-SIMETH 200-200-20 MG/5ML PO SUSP
30.0000 mL | Freq: Once | ORAL | Status: AC
  Administered 2022-10-24: 30 mL via ORAL
  Filled 2022-10-24: qty 30

## 2022-10-24 NOTE — ED Notes (Signed)
No answer with name call x3.  

## 2022-10-24 NOTE — ED Notes (Signed)
Called for vials no answer X1

## 2022-10-24 NOTE — ED Triage Notes (Signed)
Pt here from home with c/o epigastric pain that has been ongoing for a couple of days no n/v hx of gerd ,is not currently taking meds for gerd ( info obtained with interpreter )

## 2022-10-24 NOTE — ED Notes (Signed)
Pt returned from US

## 2022-10-24 NOTE — ED Provider Notes (Signed)
[facility] EMERGENCY DEPARTMENT Provider Note   CSN: [id number] Arrival date & time: 10/24/22  0157     History {Add pertinent medical, surgical, social history, OB history to HPI:1} Chief Complaint  Patient presents with  . Abdominal Pain    Lori Young is a 36 y.o. female.  36 year old female with past medical history significant for nephrolithiasis presents today for evaluation of epigastric abdominal pain, left-sided flank pain which has been ongoing since yesterday.  She denies hematuria, dysuria, vaginal discharge, vaginal bleeding.  She also endorses left-sided chest pain for the past week which is intermittent and dull.  Denies shortness of breath.  She states the epigastric abdominal pain has been going on for quite some time but worsened yesterday.  The flank pain is new since yesterday.  He also states that this feels similar to when she had a kidney stone.  She denies recent travel, leg pain, or leg swelling, history of PE, or DVT, exogenous estrogen use.  The history is provided by the patient. A language interpreter was used.       Home Medications Prior to Admission medications   Medication Sig Start Date End Date Taking? Authorizing Provider  acetaminophen (TYLENOL) 500 MG tablet Take 1,000 mg by mouth every 6 (six) hours as needed for headache.    [provider]  aspirin-acetaminophen-caffeine (EXCEDRIN MIGRAINE) [phone] MG tablet Take 2 tablets by mouth daily as needed for headache or migraine.    [provider]  omeprazole (PRILOSEC) 20 MG capsule Take 1 capsule (20 mg total) by mouth daily. 02/15/22   Grayce Sessions, NP  ondansetron (ZOFRAN ODT) 4 MG disintegrating tablet Take 1 tablet (4 mg total) by mouth every 8 (eight) hours as needed for nausea or vomiting. 07/28/21   Petrucelli, Pleas Koch, PA-C  oxyCODONE-acetaminophen (PERCOCET/ROXICET) 5-325 MG tablet Take 1 tablet by mouth every 6 (six) hours as needed  for up to 15 doses for severe pain. 07/07/21   Terald Sleeper, MD  tamsulosin (FLOMAX) 0.4 MG CAPS capsule Take 1 capsule (0.4 mg total) by mouth daily after breakfast. 06/18/21   Harlene Salts A, PA-C      Allergies    Lactose intolerance (gi)    Review of Systems   Review of Systems  Physical Exam Updated Vital Signs BP (!) 104/52 (BP Location: Right Arm)   Pulse 72   Temp 98.1 F (36.7 C)   Resp 14   SpO2 100%  Physical Exam Vitals and nursing note reviewed.  Constitutional:      General: She is not in acute distress.    Appearance: Normal appearance. She is not ill-appearing.  HENT:     Head: Normocephalic and atraumatic.     Nose: Nose normal.  Eyes:     General: No scleral icterus.    Extraocular Movements: Extraocular movements intact.     Conjunctiva/sclera: Conjunctivae normal.  Cardiovascular:     Rate and Rhythm: Normal rate and regular rhythm.     Pulses: Normal pulses.  Pulmonary:     Effort: Pulmonary effort is normal. No respiratory distress.     Breath sounds: Normal breath sounds. No wheezing or rales.  Abdominal:     General: There is no distension.     Palpations: Abdomen is soft.     Tenderness: There is abdominal tenderness (mild ttp diffusely). There is right CVA tenderness and left CVA tenderness. There is no guarding or rebound.  Musculoskeletal:  General: Normal range of motion.     Cervical back: Normal range of motion.     Right lower leg: No edema.     Left lower leg: No edema.  Skin:    General: Skin is warm and dry.  Neurological:     General: No focal deficit present.     Mental Status: She is alert. Mental status is at baseline.    ED Results / Procedures / Treatments   Labs (all labs ordered are listed, but only abnormal results are displayed) Labs Reviewed  COMPREHENSIVE METABOLIC PANEL - Abnormal; Notable for the following components:      Result Value   Glucose, Bld 107 (*)    Calcium 8.7 (*)    ALT 45 (*)     All other components within normal limits  LIPASE, BLOOD  CBC WITH DIFFERENTIAL/PLATELET  URINALYSIS, ROUTINE W REFLEX MICROSCOPIC  I-STAT BETA HCG BLOOD, ED ([facility], [facility], [facility] ONLY)  TROPONIN I (HIGH SENSITIVITY)    EKG None  Radiology No results found.  Procedures Procedures  {Document cardiac monitor, telemetry assessment procedure when appropriate:1}  Medications Ordered in ED Medications  lactated ringers bolus 1,000 mL (has no administration in time range)  morphine (PF) 4 MG/ML injection 4 mg (has no administration in time range)  ondansetron (ZOFRAN) injection 4 mg (has no administration in time range)  alum & mag hydroxide-simeth (MAALOX/MYLANTA) 200-200-20 MG/5ML suspension 30 mL (30 mLs Oral Given 10/24/22 1018)    And  lidocaine (XYLOCAINE) 2 % viscous mouth solution 15 mL (15 mLs Oral Given 10/24/22 1018)    ED Course/ Medical Decision Making/ A&P                           Medical Decision Making Amount and/or Complexity of Data Reviewed Labs: ordered. Radiology: ordered.  Risk OTC drugs. Prescription drug management.   Medical Decision Making / ED Course   This patient presents to the ED for concern of abdominal pain, chest pain, this involves an extensive number of treatment options, and is a complaint that carries with it a high risk of complications and morbidity.  The differential diagnosis includes ACS, pneumonia, PE, gastritis, cholecystitis, gastroenteritis, appendicitis  MDM: 36 year old female with past medical history of kidney stones presents today for evaluation of epigastric abdominal pain, left-sided flank pain, as well as some intermittent left-sided chest pain ongoing for the past week.  Does endorse nausea but denies vomiting.  Denies fever.  Denies any urinary symptoms.  Overall well-appearing, without acute distress.  Work-up so far reveals CBC which is unremarkable, CMP with glucose of 107, ALT of 45 otherwise unremarkable.  Lipase within  normal limits.  Initial troponin of 4.  UA without evidence of UTI.  Chest x-ray without acute cardiopulmonary process.  CT abdomen pelvis shows cholelithiasis, questionable dilated gallbladder wall, otherwise without acute process.  No definite signs of cholecystitis on CT.  Will obtain right upper quadrant ultrasound.  Right upper quadrant ultrasound shows cholelithiasis, as well as a 1.9 cm gallstone lodged in the gallbladder neck.  No signs of acute cholecystitis.   Lab Tests: -I ordered, reviewed, and interpreted labs.   The pertinent results include:   Labs Reviewed  COMPREHENSIVE METABOLIC PANEL - Abnormal; Notable for the following components:      Result Value   Glucose, Bld 107 (*)    Calcium 8.7 (*)    ALT 45 (*)    All other components within  normal limits  URINALYSIS, ROUTINE W REFLEX MICROSCOPIC - Abnormal; Notable for the following components:   APPearance HAZY (*)    Hgb urine dipstick MODERATE (*)    All other components within normal limits  LIPASE, BLOOD  CBC WITH DIFFERENTIAL/PLATELET  I-STAT BETA HCG BLOOD, ED ([facility], [facility], [facility] ONLY)  TROPONIN I (HIGH SENSITIVITY)  TROPONIN I (HIGH SENSITIVITY)      EKG  EKG Interpretation  Date/Time:    Ventricular Rate:    PR Interval:    QRS Duration:   QT Interval:    QTC Calculation:   R Axis:     Text Interpretation:           Imaging Studies ordered: I ordered imaging studies including chest x-ray, CT abdomen pelvis with contrast, right upper quadrant ultrasound I independently visualized and interpreted imaging. I agree with the radiologist interpretation   Medicines ordered and prescription drug management: Meds ordered this encounter  Medications  . AND Linked Order Group   . alum & mag hydroxide-simeth (MAALOX/MYLANTA) 200-200-20 MG/5ML suspension 30 mL   . lidocaine (XYLOCAINE) 2 % viscous mouth solution 15 mL  . lactated ringers bolus 1,000 mL  . morphine (PF) 4 MG/ML injection 4 mg  . ondansetron  (ZOFRAN) injection 4 mg    -I have reviewed the patients home medicines and have made adjustments as needed  Consultations Obtained: I requested consultation with the ***,  and discussed lab and imaging findings as well as pertinent plan - they recommend: ***   Reevaluation: After the interventions noted above, I reevaluated the patient and found that they have :improved  Co morbidities that complicate the patient evaluation . Past Medical History:  Diagnosis Date  . Anginal pain ([place])   . Anxiety   . Complication of anesthesia   . Depression   . History of kidney stones   . PONV (postoperative nausea and vomiting)   . Tachycardia       Dispostion: ***   Final Clinical Impression(s) / ED Diagnoses Final diagnoses:  None    Rx / DC Orders ED Discharge Orders     None

## 2022-10-24 NOTE — ED Notes (Signed)
Pt to US via stretcher

## 2022-10-24 NOTE — ED Notes (Signed)
Pt found  

## 2022-10-24 NOTE — ED Notes (Signed)
Pt ambulatory to radiology, tolerated well.

## 2022-10-24 NOTE — Discharge Instructions (Signed)
Your work-up today showed that you have gallstones but no signs of an acute gallbladder infection.  If you have symptoms from gallstones you may need to have your gallbladder taken out.  I have given you information for a general surgeon.  Please schedule a follow-up appointment with them.  For any concerning symptoms return to the emergency room.  You also have blood work, chest x-ray, and EKG done to evaluate your chest pain.  No concerning cause of chest pain noted.  Follow-up with your primary care provider.

## 2022-10-25 MED ORDER — ONDANSETRON 4 MG PO TBDP: 4.0000 mg | ORAL_TABLET | Freq: Three times a day (TID) | ORAL | 0 refills | Status: AC | PRN

## 2022-10-25 MED ORDER — OXYCODONE-ACETAMINOPHEN 5-325 MG PO TABS: 1.0000 | ORAL_TABLET | ORAL | 0 refills | Status: AC | PRN

## 2022-10-25 NOTE — ED Provider Notes (Signed)
  Provider Note MRN:  [id number]  Arrival date & time: 10/25/22    ED Course and Medical Decision Making  Assumed care from PA Amjad Denmark at shift change.  Symptomatic cholelithiasis with stone lodged in the neck, general surgery consulted for management.  Procedures  Final Clinical Impressions(s) / ED Diagnoses     ICD-10-CM   1. Symptomatic cholelithiasis  K80.20     2. Atypical chest pain  R07.89       ED Discharge Orders     None         Discharge Instructions      Your work-up today showed that you have gallstones but no signs of an acute gallbladder infection.  If you have symptoms from gallstones you may need to have your gallbladder taken out.  I have given you information for a general surgeon.  Please schedule a follow-up appointment with them.  For any concerning symptoms return to the emergency room.  You also have blood work, chest x-ray, and EKG done to evaluate your chest pain.  No concerning cause of chest pain noted.  Follow-up with your primary care provider.     Barth Kirks. Sedonia Small, [place] [email or web address]    Maudie Flakes, MD 10/25/22 [phone]

## 2022-10-25 NOTE — ED Notes (Signed)
Pt given water for PO challenge 

## 2022-10-25 NOTE — ED Provider Notes (Signed)
Feels improved.  Ambulates without difficulty.  DC with outpatient surgery follow-up.   Montine Circle, PA-C 10/25/22 0120    Maudie Flakes, MD 10/25/22 [phone]
